# Patient Record
Sex: Male | Born: 1975 | Race: Black or African American | Hispanic: No | Marital: Married | Smoking: Former smoker
Health system: Southern US, Community
[De-identification: ages and names within clinical notes are randomized; demographics above are authoritative.]

## PROBLEM LIST (undated history)

## (undated) DIAGNOSIS — R569 Unspecified convulsions: Secondary | ICD-10-CM

## (undated) DIAGNOSIS — R002 Palpitations: Secondary | ICD-10-CM

## (undated) DIAGNOSIS — F419 Anxiety disorder, unspecified: Secondary | ICD-10-CM

## (undated) DIAGNOSIS — I10 Essential (primary) hypertension: Secondary | ICD-10-CM

## (undated) DIAGNOSIS — G40909 Epilepsy, unspecified, not intractable, without status epilepticus: Secondary | ICD-10-CM

## (undated) HISTORY — DX: Anxiety disorder, unspecified: F41.9

## (undated) HISTORY — DX: Palpitations: R00.2

---

## 1998-06-01 ENCOUNTER — Emergency Department (HOSPITAL_COMMUNITY): Admission: EM | Admit: 1998-06-01 | Discharge: 1998-06-01 | Payer: Self-pay

## 1998-06-09 ENCOUNTER — Emergency Department (HOSPITAL_COMMUNITY): Admission: EM | Admit: 1998-06-09 | Discharge: 1998-06-09 | Payer: Self-pay | Admitting: Internal Medicine

## 1998-07-18 ENCOUNTER — Ambulatory Visit (HOSPITAL_COMMUNITY): Admission: RE | Admit: 1998-07-18 | Discharge: 1998-07-18 | Payer: Self-pay | Admitting: Psychiatry

## 2000-03-08 ENCOUNTER — Encounter: Payer: Self-pay | Admitting: Emergency Medicine

## 2000-03-08 ENCOUNTER — Emergency Department (HOSPITAL_COMMUNITY): Admission: EM | Admit: 2000-03-08 | Discharge: 2000-03-08 | Payer: Self-pay | Admitting: *Deleted

## 2000-03-12 ENCOUNTER — Encounter: Payer: Self-pay | Admitting: Emergency Medicine

## 2000-03-12 ENCOUNTER — Emergency Department (HOSPITAL_COMMUNITY): Admission: EM | Admit: 2000-03-12 | Discharge: 2000-03-12 | Payer: Self-pay | Admitting: Emergency Medicine

## 2000-11-20 ENCOUNTER — Emergency Department (HOSPITAL_COMMUNITY): Admission: EM | Admit: 2000-11-20 | Discharge: 2000-11-20 | Payer: Self-pay | Admitting: Emergency Medicine

## 2001-01-27 ENCOUNTER — Emergency Department (HOSPITAL_COMMUNITY): Admission: EM | Admit: 2001-01-27 | Discharge: 2001-01-27 | Payer: Self-pay | Admitting: Emergency Medicine

## 2004-08-13 ENCOUNTER — Emergency Department (HOSPITAL_COMMUNITY): Admission: EM | Admit: 2004-08-13 | Discharge: 2004-08-13 | Payer: Self-pay | Admitting: Emergency Medicine

## 2005-11-27 ENCOUNTER — Encounter: Admission: RE | Admit: 2005-11-27 | Discharge: 2005-11-27 | Payer: Self-pay | Admitting: Orthopedic Surgery

## 2005-12-13 ENCOUNTER — Ambulatory Visit (HOSPITAL_BASED_OUTPATIENT_CLINIC_OR_DEPARTMENT_OTHER): Admission: RE | Admit: 2005-12-13 | Discharge: 2005-12-13 | Payer: Self-pay | Admitting: Orthopedic Surgery

## 2006-11-20 HISTORY — PX: SHOULDER ARTHROSCOPY W/ ACROMIAL REPAIR: SUR94

## 2008-06-06 ENCOUNTER — Encounter: Admission: RE | Admit: 2008-06-06 | Discharge: 2008-06-06 | Payer: Self-pay | Admitting: Family Medicine

## 2009-01-14 ENCOUNTER — Emergency Department (HOSPITAL_COMMUNITY): Admission: EM | Admit: 2009-01-14 | Discharge: 2009-01-14 | Payer: Self-pay | Admitting: Emergency Medicine

## 2010-02-02 ENCOUNTER — Emergency Department (HOSPITAL_COMMUNITY): Admission: EM | Admit: 2010-02-02 | Discharge: 2010-02-02 | Payer: Self-pay | Admitting: Emergency Medicine

## 2010-11-02 ENCOUNTER — Encounter
Admission: RE | Admit: 2010-11-02 | Discharge: 2010-11-02 | Payer: Self-pay | Source: Home / Self Care | Attending: Occupational Medicine | Admitting: Occupational Medicine

## 2011-04-07 NOTE — Op Note (Signed)
NAMEMAANAV, KASSABIAN               ACCOUNT NO.:  [id number]   MEDICAL RECORD NO.:  [id number]          PATIENT TYPE:  AMB   LOCATION:  DSC                          FACILITY:  [facility]   PHYSICIAN:  [name]. Sherlean Foot, M.D. DATE OF BIRTH:  1976-01-01   DATE OF PROCEDURE:  01/01/2006  DATE OF DISCHARGE:  12/13/2005                                 OPERATIVE REPORT   PREOPERATIVE DIAGNOSIS:  Left shoulder loose bodies and subacromial  impingement syndrome.   POSTOPERATIVE DIAGNOSIS:  Left shoulder loose bodies and subacromial  impingement syndrome.   OPERATION PERFORMED:  Left shoulder arthroscopy, subacromial decompression,  removal of loose bodies.   SURGEON:  [name]. Sherlean Foot, M.D.   ASSISTANT:  None.   ANESTHESIA:  General.   DESCRIPTION OF PROCEDURE:  The patient was laid supine and administered  general anesthesia.  Then placed in beach chair position.  Shoulder was  prepped and draped in the usual sterile fashion.  Anterior, posterior and  direct lateral portals were created with a #11 blade, blunt trocar and  cannula.  Diagnostic arthroscopy revealed several large loose bodies  approximately 10 to 12 and each being approximately 0.5 x 1 cm.  Almost  looked like synovial osteochondromatosis.  He had a history of trauma, so  these could have been traumatic loose bodies that had been growing through  the years.  After all of these were fished out, I then went into the  subacromial space and performed a subacromial decompression with a 4.0 mm  cylindrical bur from the direct lateral portal.  I then  __________ CA  ligament release with the arthroscope debridement wand.  I then closed with  4-0 nylon sutures, addressed with Adaptic, 4 x 4s, sterile Webril and Ace  wrap.           ______________________________  [name]. Sherlean Foot, M.D.     SDL/MEDQ  D:  01/01/2006  T:  01/01/2006  Job:  [id number]

## 2011-04-07 NOTE — Op Note (Signed)
NAMEGARI, HARTSELL               ACCOUNT NO.:  [id number]   MEDICAL RECORD NO.:  [id number]          PATIENT TYPE:  AMB   LOCATION:  DSC                          FACILITY:  [facility]   PHYSICIAN:  [name]. Sherlean Foot, M.D. DATE OF BIRTH:  1976/01/25   DATE OF PROCEDURE:  12/13/2005  DATE OF DISCHARGE:                                 OPERATIVE REPORT   SURGEON:  [name]. Sherlean Foot, M.D.   ASSISTANT:  None.   ANESTHESIA:  General.   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   PROCEDURE:  Left shoulder arthroscopy with multiple loose body removal,  glenohumeral debridement, and subacromial decompression.   INDICATIONS FOR PROCEDURE:  The patient is a 35 year old black male with  mechanical symptoms and MR arthrogram evidence of multiple loose bodies in  the joint.  He also had impingement syndrome with some changes in the  rotator cuff.  Informed consent was obtained.   DESCRIPTION OF PROCEDURE:  The patient was laid supine and administered  general anesthesia on the operating table.  He was placed in the beach chair  position and the left shoulder was prepped and draped in the usual sterile  fashion.  Inferolateral and inferomedial portals were created with a number  11 blade, blunt trocar, and cannula.  Diagnostic arthroscopy of the  glenohumeral joint revealed lots and lots of synovitis and pretty  significant arthritic changes in the glenoid anteriorly and on the humeral  head anteriorly.  There was approximately 14 loose bodies measuring anywhere  from 3 by 3 mm up to 1.5 by 1 cm.  These were removed sequentially from the  joint.  Once I had gotten those out, I debrided the synovitis with the  smaller Gator shaver.  I then went to the subacromial space and from the  direct lateral portal performed a bursectomy.  I then used the ArthroCare  debridement wand to perform a CA ligament release and further debrided off  the acromion.  I then used the cylindrical 4 mm bur to perform an  anterolateral acromioplasty.  I then closed with 4-0 nylon sutures, dressed  with Xeroform, dressing sponges, sterile ABDs, and 2 inch silk tape, and a  simple sling.  Complications none.  Drains none.  Estimated blood loss  minimal.           ______________________________  [name]. Sherlean Foot, M.D.     SDL/MEDQ  D:  12/13/2005  T:  12/13/2005  Job:  [id number]

## 2011-09-16 ENCOUNTER — Emergency Department (HOSPITAL_COMMUNITY)
Admission: EM | Admit: 2011-09-16 | Discharge: 2011-09-16 | Discharge status: Against Medical Advice | Payer: Self-pay | Attending: Emergency Medicine | Admitting: Emergency Medicine

## 2012-01-02 ENCOUNTER — Emergency Department (HOSPITAL_BASED_OUTPATIENT_CLINIC_OR_DEPARTMENT_OTHER)
Admission: EM | Admit: 2012-01-02 | Discharge: 2012-01-02 | Disposition: A | Discharge status: Home / Self Care | Payer: Managed Care, Other (non HMO) | Attending: Emergency Medicine | Admitting: Emergency Medicine

## 2012-01-02 ENCOUNTER — Encounter (HOSPITAL_BASED_OUTPATIENT_CLINIC_OR_DEPARTMENT_OTHER): Payer: Self-pay | Admitting: *Deleted

## 2012-01-02 DIAGNOSIS — M542 Cervicalgia: Secondary | ICD-10-CM | POA: Insufficient documentation

## 2012-01-02 DIAGNOSIS — R6884 Jaw pain: Secondary | ICD-10-CM | POA: Insufficient documentation

## 2012-01-02 DIAGNOSIS — R569 Unspecified convulsions: Secondary | ICD-10-CM | POA: Insufficient documentation

## 2012-01-02 DIAGNOSIS — H9209 Otalgia, unspecified ear: Secondary | ICD-10-CM | POA: Insufficient documentation

## 2012-01-02 DIAGNOSIS — I10 Essential (primary) hypertension: Secondary | ICD-10-CM | POA: Insufficient documentation

## 2012-01-02 HISTORY — DX: Essential (primary) hypertension: I10

## 2012-01-02 HISTORY — DX: Epilepsy, unspecified, not intractable, without status epilepticus: G40.909

## 2012-01-02 MED ORDER — IBUPROFEN 800 MG PO TABS: 800.0000 mg | ORAL_TABLET | Freq: Three times a day (TID) | ORAL | Status: AC

## 2012-01-02 MED ORDER — IBUPROFEN 800 MG PO TABS
800.0000 mg | ORAL_TABLET | Freq: Once | ORAL | Status: AC
  Administered 2012-01-02: 800 mg via ORAL
  Filled 2012-01-02: qty 1

## 2012-01-02 NOTE — ED Notes (Signed)
Pt c/o right jaw/throat /ear pain x 2 days

## 2012-01-02 NOTE — Discharge Instructions (Signed)

## 2012-01-02 NOTE — ED Provider Notes (Signed)
History     CSN: [id number]  Arrival date & time 01/02/12  1846   First MD Initiated Contact with Patient 01/02/12 1935      Chief Complaint  Patient presents with  . Jaw Pain  . Otalgia    (Consider location/radiation/quality/duration/timing/severity/associated sxs/prior treatment) Patient is a 36 y.o. male presenting with ear pain. The history is provided by the patient. No language interpreter was used.  Otalgia This is a new problem. The current episode started 2 days ago. There is pain in the right ear. The problem occurs constantly. There has been no fever. Associated symptoms include rhinorrhea. Pertinent negatives include no sore throat. His past medical history does not include chronic ear infection.  Pt reports he went to dentist today and had teeth cleaned.  Pt reports he had some sorness in right jaw.Pt reports dentist did not see any problems.  Pt complains of pain in right ear and in right side of neck  Past Medical History  Diagnosis Date  . Epilepsy   . Hypertension     Past Surgical History  Procedure Date  . Joint replacement     History reviewed. No pertinent family history.  History  Substance Use Topics  . Smoking status: Never Smoker   . Smokeless tobacco: Not on file  . Alcohol Use: No      Review of Systems  HENT: Positive for ear pain and rhinorrhea. Negative for sore throat.   All other systems reviewed and are negative.    Allergies  Review of patient's allergies indicates no known allergies.  Home Medications   Current Outpatient Rx  Name Route Sig Dispense Refill  . LEVETIRACETAM 500 MG PO TABS Oral Take 500 mg by mouth every 12 (twelve) hours.    Marland Kitchen LISINOPRIL 5 MG PO TABS Oral Take 5 mg by mouth daily.    . ADULT MULTIVITAMIN W/MINERALS CH Oral Take 1 tablet by mouth daily.      BP 151/66  Pulse 69  Temp(Src) 98.4 F (36.9 C) (Oral)  Resp 16  Ht 6' (1.829 m)  Wt 220 lb (99.791 kg)  BMI 29.84 kg/m2  SpO2 99%  Physical  Exam  Nursing note and vitals reviewed. Constitutional: He appears well-developed and well-nourished.  HENT:  Head: Normocephalic and atraumatic.  Left Ear: External ear normal.  Nose: Nose normal.  Mouth/Throat: Oropharynx is clear and moist.       Left tm clear. Right tm,  Question fluid  Eyes: Conjunctivae and EOM are normal. Pupils are equal, round, and reactive to light.  Neck: Normal range of motion. Neck supple.  Cardiovascular: Normal rate and normal heart sounds.   Pulmonary/Chest: Effort normal.  Abdominal: Soft.  Neurological: He is alert.  Skin: Skin is warm and dry.  Psychiatric: He has a normal mood and affect.    ED Course  Procedures (including critical care time)  Labs Reviewed - No data to display No results found.   No diagnosis found.    MDM  I advised pt ibuprofen,  Decongestants.  Return if any problems.        Langston Masker, [place] 01/02/12 2009

## 2012-01-04 NOTE — ED Provider Notes (Signed)
Medical screening examination/treatment/procedure(s) were performed by non-physician practitioner and as supervising physician I was immediately available for consultation/collaboration.  Ethelda Chick, MD 01/04/12 [phone]

## 2012-02-02 ENCOUNTER — Other Ambulatory Visit: Payer: Self-pay | Admitting: Physician Assistant

## 2012-03-06 ENCOUNTER — Ambulatory Visit (INDEPENDENT_AMBULATORY_CARE_PROVIDER_SITE_OTHER): Payer: Managed Care, Other (non HMO) | Admitting: Family Medicine

## 2012-03-06 VITALS — BP 143/79 | HR 83 | Temp 97.9°F | Resp 16 | Ht 71.0 in | Wt 220.0 lb

## 2012-03-06 DIAGNOSIS — I10 Essential (primary) hypertension: Secondary | ICD-10-CM

## 2012-03-06 LAB — COMPREHENSIVE METABOLIC PANEL
ALT: 85 U/L — ABNORMAL HIGH (ref 0–53)
AST: 58 U/L — ABNORMAL HIGH (ref 0–37)
BUN: 16 mg/dL (ref 6–23)
CO2: 26 mEq/L (ref 19–32)
Calcium: 9.2 mg/dL (ref 8.4–10.5)
Chloride: 106 mEq/L (ref 96–112)
Potassium: 4.1 mEq/L (ref 3.5–5.3)
Total Protein: 6.6 g/dL (ref 6.0–8.3)

## 2012-03-06 MED ORDER — LISINOPRIL 5 MG PO TABS: 5.0000 mg | ORAL_TABLET | Freq: Every day | ORAL | Status: DC

## 2012-03-06 NOTE — Progress Notes (Signed)
  Patient Name: Jim Fisher Date of Birth: 1976-08-15 Medical Record Number: [id number] Gender: male Date of Encounter: 03/06/2012  History of Present Illness:  Jim Fisher is a 36 y.o. very pleasant male patient who presents with the following:  Here today to have his BP medication refilled.  He notes no problems such as CP or SOB.  Exercises regularly.  He checks his BP at the pharmacy at times and generally gets about 120/ 80 There is no problem list on file for this patient.  Past Medical History  Diagnosis Date  . Epilepsy   . Hypertension    Past Surgical History  Procedure Date  . Joint replacement    History  Substance Use Topics  . Smoking status: Never Smoker   . Smokeless tobacco: Not on file  . Alcohol Use: No   No family history on file. No Known Allergies  Medication list has been reviewed and updated.  Review of Systems: As per HPI- otherwise negative.   Physical Examination: Filed Vitals:   03/06/12 1345  BP: 143/79  Pulse: 83  Temp: 97.9 F (36.6 C)  TempSrc: Oral  Resp: 16  Height: 5\' 11"  (1.803 m)  Weight: 220 lb (99.791 kg)    Body mass index is 30.68 kg/(m^2).  GEN: WDWN, NAD, Non-toxic, A & O x 3 HEENT: Atraumatic, Normocephalic. Neck supple. No masses, No LAD. Ears and Nose: No external deformity. CV: RRR, No M/G/R. No JVD. No thrill. No extra heart sounds. PULM: CTA B, no wheezes, crackles, rhonchi. No retractions. No resp. distress. No accessory muscle use. ABD: S, NT, ND, +BS. No rebound. No HSM. EXTR: No c/c/e NEURO Normal gait.  PSYCH: Normally interactive. Conversant. Not depressed or anxious appearing.  Calm demeanor.    Assessment and Plan: 1. Hypertension  lisinopril (PRINIVIL,ZESTRIL) 5 MG tablet, Comprehensive metabolic panel   Refilled medication, doing well.  Await labs and will contact with results

## 2012-03-07 ENCOUNTER — Encounter: Payer: Self-pay | Admitting: Family Medicine

## 2012-10-15 ENCOUNTER — Other Ambulatory Visit: Payer: Self-pay | Admitting: Physician Assistant

## 2012-10-15 ENCOUNTER — Telehealth: Payer: Self-pay

## 2012-10-15 MED ORDER — CLONAZEPAM 0.5 MG PO TABS: 0.5000 mg | ORAL_TABLET | Freq: Two times a day (BID) | ORAL | Status: DC | PRN

## 2012-10-15 NOTE — Telephone Encounter (Signed)
Pull paper chart 

## 2012-10-15 NOTE — Telephone Encounter (Signed)
Chart pulled it is in your box. Patient was previously given Klonopin 0.5mg  for flight last year at this time. Amy Pended Rx if you approve I can call in for him, just let me know.

## 2012-10-15 NOTE — Telephone Encounter (Signed)
Needs office visit.

## 2012-10-15 NOTE — Telephone Encounter (Signed)
Chart pulled to PA pool at nurses station [phone]

## 2012-10-15 NOTE — Telephone Encounter (Signed)
Looks like Lanora Manis has done this already, but it was not sent yet. I called it in. Laurence Folz

## 2012-10-15 NOTE — Telephone Encounter (Signed)
That is fine, he can have #10 to use PRN flight, max of 2 in a 24 hour period.

## 2012-10-15 NOTE — Telephone Encounter (Signed)
PT STATES HE IS GOING ON A FLIGHT AND NEEDED HIS ANXIETY MEDICINE CALLED IN. HAVE BEEN OVER A YEAR AGO SO HE ISN'T SURE THE NAME OF IT PLEASE CALL [phone]     [facility] [address]

## 2012-10-15 NOTE — Telephone Encounter (Signed)
Prescription for #10 printed at desk, needs OV.

## 2012-10-15 NOTE — Telephone Encounter (Signed)
That is fine, he can have #10, sig take 1/2 or 1 po prn flight, max 2 in 24 hours

## 2012-11-25 ENCOUNTER — Encounter: Payer: Self-pay | Admitting: Family Medicine

## 2012-11-25 ENCOUNTER — Ambulatory Visit (INDEPENDENT_AMBULATORY_CARE_PROVIDER_SITE_OTHER): Payer: Managed Care, Other (non HMO) | Admitting: Family Medicine

## 2012-11-25 VITALS — BP 126/90 | HR 70 | Temp 97.8°F | Resp 16 | Ht 71.0 in | Wt 225.0 lb

## 2012-11-25 DIAGNOSIS — Z0000 Encounter for general adult medical examination without abnormal findings: Secondary | ICD-10-CM

## 2012-11-25 DIAGNOSIS — Z23 Encounter for immunization: Secondary | ICD-10-CM

## 2012-11-25 DIAGNOSIS — I10 Essential (primary) hypertension: Secondary | ICD-10-CM

## 2012-11-25 LAB — COMPREHENSIVE METABOLIC PANEL
Glucose, Bld: 79 mg/dL (ref 70–99)
Sodium: 136 mEq/L (ref 135–145)
Total Bilirubin: 0.5 mg/dL (ref 0.3–1.2)
Total Protein: 7.4 g/dL (ref 6.0–8.3)

## 2012-11-25 LAB — LIPID PANEL
Cholesterol: 221 mg/dL — ABNORMAL HIGH (ref 0–200)
LDL Cholesterol: 156 mg/dL — ABNORMAL HIGH (ref 0–99)
Triglycerides: 70 mg/dL (ref <150)

## 2012-11-25 MED ORDER — LISINOPRIL 5 MG PO TABS: 5.0000 mg | ORAL_TABLET | Freq: Every day | ORAL | Status: DC

## 2012-11-25 NOTE — Progress Notes (Signed)
Subjective:    Patient ID: Jim Fisher, male    DOB: [birth year], 37 y.o.   MRN: [id number]  HPI Jim Fisher is a 37 y.o. male  Here for CPE. Doing well.  Fasting for 8 hours now.   Last Ov April 2013 for HTN. Controlled then. No recent outside Bp's.  Results for orders placed in visit on 03/06/12  COMPREHENSIVE METABOLIC PANEL      Component Value Range   Sodium 142  135 - 145 mEq/L   Potassium 4.1  3.5 - 5.3 mEq/L   Chloride 106  96 - 112 mEq/L   CO2 26  19 - 32 mEq/L   Glucose, Bld 87  70 - 99 mg/dL   BUN 16  6 - 23 mg/dL   Creat 7.25  3.66 - 4.40 mg/dL   Total Bilirubin 0.5  0.3 - 1.2 mg/dL   Alkaline Phosphatase 32 (*) 39 - 117 U/L   AST 58 (*) 0 - 37 U/L   ALT 85 (*) 0 - 53 U/L   Total Protein 6.6  6.0 - 8.3 g/dL   Albumin 4.2  3.5 - 5.2 g/dL   Calcium 9.2  8.4 - 34.7 mg/dL   Hx of seizure d/o - takes Keppra.  Neuro: [facility]. No recent seizure - last experienced 4 years ago.  No new side effects.   Ringing in ear only today.  No difficulty hearing.  No pain.   Still working at [employer].  Plans on trying out for [employer] [facility] [employer] Department next year (has to be seizure free for 5 years).   Married in '08 - 2 yo dtr.  Doing well.   Review of Systems  All other systems reviewed and are negative.  As on PHS,and reviewed CMA notes.     Objective:   Physical Exam  Vitals reviewed. Constitutional: He is oriented to person, place, and time. He appears well-developed and well-nourished.  HENT:  Head: Normocephalic and atraumatic.  Right Ear: External ear normal.  Left Ear: External ear normal.  Mouth/Throat: Oropharynx is clear and moist.  Eyes: Conjunctivae normal and EOM are normal. Pupils are equal, round, and reactive to light.  Neck: Normal range of motion. Neck supple. No thyromegaly present.  Cardiovascular: Normal rate, regular rhythm, normal heart sounds and intact distal pulses.   Pulmonary/Chest: Effort normal and breath sounds normal. No  respiratory distress. He has no wheezes.  Abdominal: Soft. He exhibits no distension. There is no tenderness. Hernia confirmed negative in the right inguinal area and confirmed negative in the left inguinal area.  Musculoskeletal: Normal range of motion. He exhibits no edema and no tenderness.  Lymphadenopathy:    He has no cervical adenopathy.  Neurological: He is alert and oriented to person, place, and time. He has normal reflexes.  Skin: Skin is warm and dry.  Psychiatric: He has a normal mood and affect. His behavior is normal.   Filed Vitals:   11/25/12 1534  BP: 126/90  Pulse: 70  Temp: 97.8 F (36.6 C)  Resp: 16       Assessment & Plan:  Jim Fisher is a 37 y.o. male 1. Routine general medical examination at a health care facility  Comprehensive metabolic panel, Lipid panel, CBC with Differential.  Anticipatory guidance given.   2. Essential hypertension, benign  Comprehensive metabolic panel.  Controlled.  meds refilled x 1 year.   3. Hypertension  lisinopril (PRINIVIL,ZESTRIL) 5 MG tablet  4. Need for prophylactic vaccination and  inoculation against influenza  Flu vaccine greater than or equal to 3yo with preservative IM given.Marland Kitchen  Hx of seizure d/o - seizure free.  Patient Instructions  Your should receive a call or letter about your lab results within the next week to 10 days.  Keep a record of your blood pressures outside of the office and bring them to the next office visit. Recheck in 6 months  - sooner if worsening of symptoms.   Keeping you healthy  Get these tests  Blood pressure- Have your blood pressure checked once a year by your healthcare provider.  Normal blood pressure is 120/80.  Weight- Have your body mass index (BMI) calculated to screen for obesity.  BMI is a measure of body fat based on height and weight. You can also calculate your own BMI at https://www.west-esparza.com/.  Cholesterol- Have your cholesterol checked regularly starting at age 35,  sooner may be necessary if you have diabetes, high blood pressure, if a family member developed heart diseases at an early age or if you smoke.   Chlamydia, HIV, and other sexual transmitted disease- Get screened each year until the age of 39 then within three months of each new sexual partner.  Diabetes- Have your blood sugar checked regularly if you have high blood pressure, high cholesterol, a family history of diabetes or if you are overweight.  Get these vaccines  Flu shot- Every fall.  Tetanus shot- Every 10 years.  Menactra- Single dose; prevents meningitis.  Take these steps  Don't smoke- If you do smoke, ask your healthcare provider about quitting. For tips on how to quit, go to www.smokefree.gov or call 1-800-QUIT-NOW.  Be physically active- Exercise 5 days a week for at least 30 minutes.  If you are not already physically active start slow and gradually work up to 30 minutes of moderate physical activity.  Examples of moderate activity include walking briskly, mowing the yard, dancing, swimming bicycling, etc.  Eat a healthy diet- Eat a variety of healthy foods such as fruits, vegetables, low fat milk, low fat cheese, yogurt, lean meats, poultry, fish, beans, tofu, etc.  For more information on healthy eating, go to www.thenutritionsource.org  Drink alcohol in moderation- Limit alcohol intake two drinks or less a day.  Never drink and drive.  Dentist- Brush and floss teeth twice daily; visit your dentis twice a year.  Depression-Your emotional health is as important as your physical health.  If you're feeling down, losing interest in things you normally enjoy please talk with your healthcare provider.  Gun Safety- If you keep a gun in your home, keep it unloaded and with the safety lock on.  Bullets should be stored separately.  Helmet use- Always wear a helmet when riding a motorcycle, bicycle, rollerblading or skateboarding.  Safe sex- If you may be exposed to a sexually  transmitted infection, use a condom  Seat belts- Seat bels can save your life; always wear one.  Smoke/Carbon Monoxide detectors- These detectors need to be installed on the appropriate level of your home.  Replace batteries at least once a year.  Skin Cancer- When out in the sun, cover up and use sunscreen SPF 15 or higher.  Violence- If anyone is threatening or hurting you, please tell your healthcare provider.

## 2012-11-25 NOTE — Patient Instructions (Addendum)
Your should receive a call or letter about your lab results within the next week to 10 days.  Keep a record of your blood pressures outside of the office and bring them to the next office visit. Recheck in 6 months  - sooner if worsening of symptoms.   Keeping you healthy  Get these tests  Blood pressure- Have your blood pressure checked once a year by your healthcare provider.  Normal blood pressure is 120/80.  Weight- Have your body mass index (BMI) calculated to screen for obesity.  BMI is a measure of body fat based on height and weight. You can also calculate your own BMI at https://www.west-esparza.com/.  Cholesterol- Have your cholesterol checked regularly starting at age 67, sooner may be necessary if you have diabetes, high blood pressure, if a family member developed heart diseases at an early age or if you smoke.   Chlamydia, HIV, and other sexual transmitted disease- Get screened each year until the age of 27 then within three months of each new sexual partner.  Diabetes- Have your blood sugar checked regularly if you have high blood pressure, high cholesterol, a family history of diabetes or if you are overweight.  Get these vaccines  Flu shot- Every fall.  Tetanus shot- Every 10 years.  Menactra- Single dose; prevents meningitis.  Take these steps  Don't smoke- If you do smoke, ask your healthcare provider about quitting. For tips on how to quit, go to www.smokefree.gov or call 1-800-QUIT-NOW.  Be physically active- Exercise 5 days a week for at least 30 minutes.  If you are not already physically active start slow and gradually work up to 30 minutes of moderate physical activity.  Examples of moderate activity include walking briskly, mowing the yard, dancing, swimming bicycling, etc.  Eat a healthy diet- Eat a variety of healthy foods such as fruits, vegetables, low fat milk, low fat cheese, yogurt, lean meats, poultry, fish, beans, tofu, etc.  For more information on  healthy eating, go to www.thenutritionsource.org  Drink alcohol in moderation- Limit alcohol intake two drinks or less a day.  Never drink and drive.  Dentist- Brush and floss teeth twice daily; visit your dentis twice a year.  Depression-Your emotional health is as important as your physical health.  If you're feeling down, losing interest in things you normally enjoy please talk with your healthcare provider.  Gun Safety- If you keep a gun in your home, keep it unloaded and with the safety lock on.  Bullets should be stored separately.  Helmet use- Always wear a helmet when riding a motorcycle, bicycle, rollerblading or skateboarding.  Safe sex- If you may be exposed to a sexually transmitted infection, use a condom  Seat belts- Seat bels can save your life; always wear one.  Smoke/Carbon Monoxide detectors- These detectors need to be installed on the appropriate level of your home.  Replace batteries at least once a year.  Skin Cancer- When out in the sun, cover up and use sunscreen SPF 15 or higher.  Violence- If anyone is threatening or hurting you, please tell your healthcare provider.

## 2012-11-25 NOTE — Progress Notes (Signed)
  Subjective:    Patient ID: Jim Fisher, male    DOB: [birth year], 37 y.o.   MRN: [id number]  HPI    Review of Systems  Constitutional: Negative.   HENT: Positive for tinnitus.   Eyes: Negative.   Respiratory: Negative.   Cardiovascular: Negative.   Gastrointestinal: Negative.   Genitourinary: Negative.   Musculoskeletal: Negative.   Skin: Negative.   Neurological: Negative.   Hematological: Negative.   Psychiatric/Behavioral: Negative.        Objective:   Physical Exam        Assessment & Plan:

## 2012-11-26 LAB — CBC WITH DIFFERENTIAL/PLATELET
Basophils Relative: 0 % (ref 0–1)
Eosinophils Absolute: 0.1 10*3/uL (ref 0.0–0.7)
Eosinophils Relative: 1 % (ref 0–5)
Hemoglobin: 14.4 g/dL (ref 13.0–17.0)
Lymphocytes Relative: 37 % (ref 12–46)
MCV: 79.6 fL (ref 78.0–100.0)
Neutrophils Relative %: 56 % (ref 43–77)
Platelets: 308 10*3/uL (ref 150–400)
RDW: 13.7 % (ref 11.5–15.5)

## 2012-12-24 ENCOUNTER — Ambulatory Visit (INDEPENDENT_AMBULATORY_CARE_PROVIDER_SITE_OTHER): Payer: Managed Care, Other (non HMO) | Admitting: Family Medicine

## 2012-12-24 VITALS — BP 119/76 | HR 68 | Temp 98.1°F | Resp 16 | Ht 71.25 in | Wt 224.2 lb

## 2012-12-24 DIAGNOSIS — G40309 Generalized idiopathic epilepsy and epileptic syndromes, not intractable, without status epilepticus: Secondary | ICD-10-CM

## 2012-12-24 DIAGNOSIS — R319 Hematuria, unspecified: Secondary | ICD-10-CM

## 2012-12-24 LAB — POCT URINALYSIS DIPSTICK
Bilirubin, UA: NEGATIVE
Blood, UA: NEGATIVE
Glucose, UA: NEGATIVE
Ketones, UA: NEGATIVE
Leukocytes, UA: NEGATIVE
Nitrite, UA: NEGATIVE
Protein, UA: NEGATIVE
Spec Grav, UA: 1.01
Urobilinogen, UA: 0.2
pH, UA: 6.5

## 2012-12-24 LAB — POCT UA - MICROSCOPIC ONLY
Bacteria, U Microscopic: NEGATIVE
Casts, Ur, LPF, POC: NEGATIVE
Crystals, Ur, HPF, POC: NEGATIVE
Mucus, UA: NEGATIVE
RBC, urine, microscopic: NEGATIVE
WBC, Ur, HPF, POC: NEGATIVE
Yeast, UA: NEGATIVE

## 2012-12-24 NOTE — Progress Notes (Signed)
37 yo with discomfort while voiding beginning Thursday, then again last night and this am with small amount of clot.  Associated with mild flank pain on left No fever or vomiting Happily married, monogamous Works at [employer]  Objective:  NAD Results for orders placed in visit on 11/25/12  COMPREHENSIVE METABOLIC PANEL      Component Value Range   Sodium 136  135 - 145 mEq/L   Potassium 4.1  3.5 - 5.3 mEq/L   Chloride 103  96 - 112 mEq/L   CO2 26  19 - 32 mEq/L   Glucose, Bld 79  70 - 99 mg/dL   BUN 14  6 - 23 mg/dL   Creat 1.61  0.96 - 0.45 mg/dL   Total Bilirubin 0.5  0.3 - 1.2 mg/dL   Alkaline Phosphatase 31 (*) 39 - 117 U/L   AST 21  0 - 37 U/L   ALT 18  0 - 53 U/L   Total Protein 7.4  6.0 - 8.3 g/dL   Albumin 4.6  3.5 - 5.2 g/dL   Calcium 9.8  8.4 - 40.9 mg/dL  LIPID PANEL      Component Value Range   Cholesterol 221 (*) 0 - 200 mg/dL   Triglycerides 70  <811 mg/dL   HDL 51  >91 mg/dL   Total CHOL/HDL Ratio 4.3     VLDL 14  0 - 40 mg/dL   LDL Cholesterol 478 (*) 0 - 99 mg/dL  CBC WITH DIFFERENTIAL      Component Value Range   WBC 9.6  4.0 - 10.5 K/uL   RBC 5.38  4.22 - 5.81 MIL/uL   Hemoglobin 14.4  13.0 - 17.0 g/dL   HCT 29.5  62.1 - 30.8 %   MCV 79.6  78.0 - 100.0 fL   MCH 26.8  26.0 - 34.0 pg   MCHC 33.6  30.0 - 36.0 g/dL   RDW 65.7  84.6 - 96.2 %   Platelets 308  150 - 400 K/uL   Neutrophils Relative 56  43 - 77 %   Neutro Abs 5.3  1.7 - 7.7 K/uL   Lymphocytes Relative 37  12 - 46 %   Lymphs Abs 3.6  0.7 - 4.0 K/uL   Monocytes Relative 6  3 - 12 %   Monocytes Absolute 0.5  0.1 - 1.0 K/uL   Eosinophils Relative 1  0 - 5 %   Eosinophils Absolute 0.1  0.0 - 0.7 K/uL   Basophils Relative 0  0 - 1 %   Basophils Absolute 0.0  0.0 - 0.1 K/uL   Smear Review Criteria for review not met      Results for orders placed in visit on 12/24/12  POCT URINALYSIS DIPSTICK      Component Value Range   Color, UA yellow     Clarity, UA clear     Glucose, UA neg     Bilirubin, UA neg     Ketones, UA neg     Spec Grav, UA 1.010     Blood, UA neg     pH, UA 6.5     Protein, UA neg     Urobilinogen, UA 0.2     Nitrite, UA neg     Leukocytes, UA Negative    POCT UA - MICROSCOPIC ONLY      Component Value Range   WBC, Ur, HPF, POC neg     RBC, urine, microscopic neg     Bacteria, U  Microscopic neg     Mucus, UA neg     Epithelial cells, urine per micros rare     Crystals, Ur, HPF, POC neg     Casts, Ur, LPF, POC neg     Yeast, UA neg     Assessment:  No evidence of a problem on U/A.   Plan:  Urine cx uriprobe  If symptoms persist or worsen, and labs are unrevealing, will proceed with CAT scan of kidneys.

## 2012-12-25 LAB — GC/CHLAMYDIA PROBE AMP, URINE
Chlamydia, Swab/Urine, PCR: NEGATIVE
GC Probe Amp, Urine: NEGATIVE

## 2012-12-26 LAB — URINE CULTURE: Colony Count: 3000

## 2012-12-30 ENCOUNTER — Telehealth: Payer: Self-pay

## 2012-12-30 DIAGNOSIS — R319 Hematuria, unspecified: Secondary | ICD-10-CM

## 2012-12-30 DIAGNOSIS — R109 Unspecified abdominal pain: Secondary | ICD-10-CM

## 2012-12-30 NOTE — Telephone Encounter (Signed)
Called patient, Dr Milus Glazier office visit states if not better will proceed with CT Scan, have spoken to patient he is having right flank pain, and has not improved since last office visit. Pended order for CT scan.

## 2012-12-30 NOTE — Telephone Encounter (Signed)
Signed order for CT

## 2012-12-30 NOTE — Telephone Encounter (Signed)
Pt is needing to talk with someone about last visit -he states he was supposed to have a scan and referral to a specialist  But referrals has nothing   Best number [phone]

## 2012-12-30 NOTE — Addendum Note (Signed)
Addended by: Godfrey Pick on: 12/30/2012 05:57 PM   Modules accepted: Orders

## 2012-12-30 NOTE — Telephone Encounter (Signed)
Number is [phone]

## 2012-12-30 NOTE — Telephone Encounter (Signed)
Pt is needing to talk with someone about his last visit -he states that he was under the impression that he was going to sent for a ct and also referred to specialist and nothing is in his chart or for referrals  Best number

## 2012-12-30 NOTE — Addendum Note (Signed)
Addended byCaffie Damme on: 12/30/2012 12:45 PM   Modules accepted: Orders

## 2012-12-31 ENCOUNTER — Ambulatory Visit
Admission: RE | Admit: 2012-12-31 | Discharge: 2012-12-31 | Disposition: A | Discharge status: Home / Self Care | Payer: Managed Care, Other (non HMO) | Source: Ambulatory Visit | Attending: Physician Assistant | Admitting: Physician Assistant

## 2012-12-31 DIAGNOSIS — R109 Unspecified abdominal pain: Secondary | ICD-10-CM

## 2012-12-31 DIAGNOSIS — R319 Hematuria, unspecified: Secondary | ICD-10-CM

## 2013-01-01 ENCOUNTER — Telehealth: Payer: Self-pay | Admitting: Physician Assistant

## 2013-01-01 DIAGNOSIS — R319 Hematuria, unspecified: Secondary | ICD-10-CM

## 2013-01-01 NOTE — Telephone Encounter (Signed)
Referral to urology placed

## 2013-01-03 NOTE — Telephone Encounter (Signed)
Pt has had CT, been notified of results and is waiting for urology appt.

## 2013-02-18 ENCOUNTER — Other Ambulatory Visit: Payer: Self-pay | Admitting: Family Medicine

## 2013-06-28 ENCOUNTER — Other Ambulatory Visit: Payer: Self-pay | Admitting: Neurology

## 2013-07-22 ENCOUNTER — Ambulatory Visit (INDEPENDENT_AMBULATORY_CARE_PROVIDER_SITE_OTHER): Payer: Managed Care, Other (non HMO) | Admitting: Nurse Practitioner

## 2013-07-22 ENCOUNTER — Encounter: Payer: Self-pay | Admitting: Nurse Practitioner

## 2013-07-22 VITALS — BP 128/72 | Ht 73.0 in | Wt 221.0 lb

## 2013-07-22 DIAGNOSIS — G40309 Generalized idiopathic epilepsy and epileptic syndromes, not intractable, without status epilepticus: Secondary | ICD-10-CM

## 2013-07-22 MED ORDER — LEVETIRACETAM 750 MG PO TABS: 750.0000 mg | ORAL_TABLET | Freq: Two times a day (BID) | ORAL | Status: DC

## 2013-07-22 NOTE — Progress Notes (Signed)
I have read the note, and I agree with the clinical assessment and plan.  WILLIS,CHARLES KEITH   

## 2013-07-22 NOTE — Patient Instructions (Addendum)
Continue Keppra at current dose will renew Call our office for any seizure activity Followup yearly and prn

## 2013-07-22 NOTE — Progress Notes (Signed)
  Reason for visit followup for seizure disorder HPI: Jim Fisher,  37 year old black male returns for followup. He has a history of seizures. This patient has done very well on Keppra, taking 750 mg twice daily. Patient is tolerating the medication very well. Patient has changed his diet, and this has improved his hypertension, he is currently on Lisinopril.  Patient is operating a motor vehicle. Patient works in Airline pilot at [employer]. Last seizure 4.5 yrs ago. Denies side effects to Keppra.   ROS:  14 system review of symptoms is negative   Medications Current Outpatient Prescriptions on File Prior to Visit  Medication Sig Dispense Refill  . levETIRAcetam (KEPPRA) 750 MG tablet TAKE 1 TABLET BY MOUTH TWICE A DAY  60 tablet  0  . lisinopril (PRINIVIL,ZESTRIL) 5 MG tablet Take 1 tablet (5 mg total) by mouth daily.  90 tablet  3  . Multiple Vitamin (MULITIVITAMIN WITH MINERALS) TABS Take 1 tablet by mouth daily.      . NON FORMULARY daily.       No current facility-administered medications on file prior to visit.    Allergies No Known Allergies  Physical Exam General: well developed, well nourished, seated, in no evident distress Head: head normocephalic and atraumatic. Oropharynx benign Neck: supple with no carotid  bruits Cardiovascular: regular rate and rhythm, no murmurs  Neurologic Exam Mental Status: Awake and fully alert. Oriented to place and time. Follows all commands. Speech and language normal.   Cranial Nerves: Pupils equal, briskly reactive to light. Extraocular movements full without nystagmus. Visual fields full to confrontation. Hearing intact and symmetric to finger snap. Face, tongue, palate move normally and symmetrically. Neck flexion and extension normal.  Motor: Normal bulk and tone. Normal strength in all tested extremity muscles.No focal weakness Coordination: Rapid alternating movements normal in all extremities. Finger-to-nose and heel-to-shin performed accurately  bilaterally. No dysmetria Gait and Station: Arises from chair without difficulty. Stance is normal. Gait demonstrates normal stride length and balance . Able to heel, toe and tandem walk without difficulty.  Reflexes: 2+ and symmetric. Toes downgoing.     ASSESSMENT: Seizure disorder with last seizure occurring 4-1/2 years ago     PLAN: Continue Keppra at current dose will renew Followup yearly and as necessary  Nilda Riggs, GNP-BC APRN

## 2013-08-14 ENCOUNTER — Other Ambulatory Visit: Payer: Self-pay

## 2013-08-14 MED ORDER — LEVETIRACETAM 750 MG PO TABS: 750.0000 mg | ORAL_TABLET | Freq: Two times a day (BID) | ORAL | Status: DC

## 2013-08-14 NOTE — Telephone Encounter (Signed)
Pharmacy requests 90 day Rx  

## 2013-10-30 ENCOUNTER — Ambulatory Visit (INDEPENDENT_AMBULATORY_CARE_PROVIDER_SITE_OTHER): Payer: Managed Care, Other (non HMO) | Admitting: Family Medicine

## 2013-10-30 ENCOUNTER — Telehealth: Payer: Self-pay

## 2013-10-30 VITALS — BP 132/70 | HR 91 | Temp 98.7°F | Resp 18 | Ht 71.5 in | Wt 215.0 lb

## 2013-10-30 DIAGNOSIS — R197 Diarrhea, unspecified: Secondary | ICD-10-CM

## 2013-10-30 DIAGNOSIS — B349 Viral infection, unspecified: Secondary | ICD-10-CM

## 2013-10-30 DIAGNOSIS — B9789 Other viral agents as the cause of diseases classified elsewhere: Secondary | ICD-10-CM

## 2013-10-30 DIAGNOSIS — R6889 Other general symptoms and signs: Secondary | ICD-10-CM

## 2013-10-30 LAB — POCT CBC
Granulocyte percent: 70.5 % (ref 37–80)
HCT, POC: 45.8 % (ref 43.5–53.7)
Hemoglobin: 14.3 g/dL (ref 14.1–18.1)
Lymph, poc: 1.6 (ref 0.6–3.4)
MCH, POC: 27 pg (ref 27–31.2)
MCHC: 31.2 g/dL — AB (ref 31.8–35.4)
MCV: 86.5 fL (ref 80–97)
MID (cbc): 0.8 (ref 0–0.9)
MPV: 8.1 fL (ref 0–99.8)
POC Granulocyte: 5.7 (ref 2–6.9)
POC LYMPH PERCENT: 20.2 %L (ref 10–50)
POC MID %: 9.3 %M (ref 0–12)
Platelet Count, POC: 306 10*3/uL (ref 142–424)
RBC: 5.3 M/uL (ref 4.69–6.13)
RDW, POC: 13.6 %
WBC: 8.1 10*3/uL (ref 4.6–10.2)

## 2013-10-30 LAB — POCT INFLUENZA A/B
Influenza A, POC: NEGATIVE
Influenza B, POC: NEGATIVE

## 2013-10-30 NOTE — Progress Notes (Signed)
Chief Complaint:  Chief Complaint  Patient presents with  . Abdominal Pain    co-worker has flu   . Back Pain  . Knee Pain  . Cough    HPI: Jim Fisher is a 37 y.o. male  With a h/o epilepsy on Keppra who is here for abdominal pain x one day. He woke up today with body aches and a headache. There has been positive flu cases at work. He works at [employer]. He has had diarrhea with black stool. Pepto Bismol helped him shortly yesterday but didn't help for long. He has not had the flu vaccine yet. No fever that he can tell, mild nasal congestion, cough, and denies any ear pain or sore throat. He ate [name] 2 days ago and his wife just texted him and state she has similar sxs. Nonbloody diarrhea, black stool but again he has been on peptobismol. Denies nausea or vomiting. Denies fevers. + Dry cough.   Last seizure was 5 years ago. Did not get flu vaccine this year.  He is able to eat and drink but has some loose stools after PO intake.  2 days ago one of the buyers had the flu.    Past Medical History  Diagnosis Date  . Epilepsy   . Hypertension   . Anxiety    Past Surgical History  Procedure Laterality Date  . Shoulder arthroscopy w/ acromial repair  2008   History   Social History  . Marital Status: Married    Spouse Name: Jim Fisher     Number of Children: 1  . Years of Education: N/A   Occupational History  . SALES    Social History Main Topics  . Smoking status: Former Smoker -- 2 years    Types: Cigarettes    Quit date: 11/20/1996  . Smokeless tobacco: Never Used     Comment: SMOKED IN HIGH SCHOOL AT LEAST 3 CIGARETTES PER DAY  . Alcohol Use: 0.6 oz/week    1 Cans of beer per week     Comment: every 2 weeks   . Drug Use: No  . Sexual Activity: Yes     Comment: number of sex partners in the last 12 months 1   Other Topics Concern  . None   Social History Narrative   Exercise doing weights and cardio 5 x/week for 1 hour.    Patient works at [employer].   Patient has some college.   Patient is married with one child.    Family History  Problem Relation Age of Onset  . Hypertension Maternal Grandmother   . Diabetes Maternal Grandmother    No Known Allergies Prior to Admission medications   Medication Sig Start Date End Date Taking? Authorizing Provider  levETIRAcetam (KEPPRA) 750 MG tablet Take 1 tablet (750 mg total) by mouth 2 (two) times daily. 08/14/13  Yes York Spaniel, MD  lisinopril (PRINIVIL,ZESTRIL) 5 MG tablet Take 1 tablet (5 mg total) by mouth daily. 11/25/12  Yes Shade Flood, MD  Multiple Vitamin (MULITIVITAMIN WITH MINERALS) TABS Take 1 tablet by mouth daily.   Yes Historical Provider, MD     ROS: The patient denies fevers, chills, night sweats, unintentional weight loss, chest pain, palpitations, wheezing, dyspnea on exertion, dysuria, hematuria, melena, numbness, weakness, or tingling.   All other systems have been reviewed and were otherwise negative with the exception of those mentioned in the HPI and as above.    PHYSICAL EXAM: Filed Vitals:  10/30/13 1556  BP: 132/70  Pulse: 91  Temp: 98.7 F (37.1 C)  Resp: 18   Filed Vitals:   10/30/13 1556  Height: 5' 11.5" (1.816 m)  Weight: 215 lb (97.523 kg)   Body mass index is 29.57 kg/(m^2).  General: Alert, no acute distress HEENT:  Normocephalic, atraumatic, oropharynx patent. EOMI, PERRLA, TM nl, fundoscopic exam nl, oral mucose nl, boggy nares Cardiovascular:  Regular rate and rhythm, no rubs murmurs or gallops.  No Carotid bruits, radial pulse intact. No pedal edema.  Respiratory: Clear to auscultation bilaterally.  No wheezes, rales, or rhonchi.  No cyanosis, no use of accessory musculature GI: No organomegaly, abdomen is soft and non-tender, positive bowel sounds.  No masses. Skin: No rashes. Neurologic: Facial musculature symmetric. Psychiatric: Patient is appropriate throughout our interaction. Lymphatic: No cervical  lymphadenopathy Musculoskeletal: Gait intact.   LABS: Results for orders placed in visit on 10/30/13  POCT CBC      Result Value Range   WBC 8.1  4.6 - 10.2 K/uL   Lymph, poc 1.6  0.6 - 3.4   POC LYMPH PERCENT 20.2  10 - 50 %L   MID (cbc) 0.8  0 - 0.9   POC MID % 9.3  0 - 12 %M   POC Granulocyte 5.7  2 - 6.9   Granulocyte percent 70.5  37 - 80 %G   RBC 5.30  4.69 - 6.13 M/uL   Hemoglobin 14.3  14.1 - 18.1 g/dL   HCT, POC 16.1  09.6 - 53.7 %   MCV 86.5  80 - 97 fL   MCH, POC 27.0  27 - 31.2 pg   MCHC 31.2 (*) 31.8 - 35.4 g/dL   RDW, POC 04.5     Platelet Count, POC 306  142 - 424 K/uL   MPV 8.1  0 - 99.8 fL  POCT INFLUENZA A/B      Result Value Range   Influenza A, POC Negative     Influenza B, POC Negative       EKG/XRAY:   Primary read interpreted by Dr. Conley Rolls at [facility].   ASSESSMENT/PLAN: Encounter Diagnoses  Name Primary?  . Diarrhea   . Flu-like symptoms   . Viral syndrome Yes   Etiology Viral syndrome vs food related GI sxs? Probably conincidental that he has viral sxs 2 days after he had [employer]. I am not sure if diarrhea is related to food intake from [facility]. More likely he has early signs of viral URI with some loose stools and diarrhea. He denies any nausea/vomiting.  Continue to monitor, needs hydration. No nausea meds given since denies nausea.  Push fluid, BRAT diet F/u prn  Gross sideeffects, risk and benefits, and alternatives of medications d/w patient. Patient is aware that all medications have potential sideeffects and we are unable to predict every sideeffect or drug-drug interaction that may occur.  Hamilton Capri PHUONG, DO 10/30/2013 5:11 PM

## 2013-10-30 NOTE — Patient Instructions (Signed)
Diet for Diarrhea, Adult  Frequent, runny stools (diarrhea) may be caused or worsened by food or drink. Diarrhea may be relieved by changing your diet. Since diarrhea can last up to 7 days, it is easy for you to lose too much fluid from the body and become dehydrated. Fluids that are lost need to be replaced. Along with a modified diet, make sure you drink enough fluids to keep your urine clear or pale yellow.  DIET INSTRUCTIONS  · Ensure adequate fluid intake (hydration): have 1 cup (8 oz) of fluid for each diarrhea episode. Avoid fluids that contain simple sugars or sports drinks, fruit juices, whole milk products, and sodas. Your urine should be clear or pale yellow if you are drinking enough fluids. Hydrate with an oral rehydration solution that you can purchase at pharmacies, retail stores, and online. You can prepare an oral rehydration solution at home by mixing the following ingredients together:  ·   tsp table salt.  · ¾ tsp baking soda.  ·  tsp salt substitute containing potassium chloride.  · 1  tablespoons sugar.  · 1 L (34 oz) of water.  · Certain foods and beverages may increase the speed at which food moves through the gastrointestinal (GI) tract. These foods and beverages should be avoided and include:  · Caffeinated and alcoholic beverages.  · High-fiber foods, such as raw fruits and vegetables, nuts, seeds, and whole grain breads and cereals.  · Foods and beverages sweetened with sugar alcohols, such as xylitol, sorbitol, and mannitol.  · Some foods may be well tolerated and may help thicken stool including:  · Starchy foods, such as rice, toast, pasta, low-sugar cereal, oatmeal, grits, baked potatoes, crackers, and bagels.    · Bananas.    · Applesauce.  · Add probiotic-rich foods to help increase healthy bacteria in the GI tract, such as yogurt and fermented milk products.  RECOMMENDED FOODS AND BEVERAGES  Starches  Choose foods with less than 2 g of fiber per serving.  · Recommended:  White,  French, and pita breads, plain rolls, buns, bagels. Plain muffins, matzo. Soda, saltine, or graham crackers. Pretzels, melba toast, zwieback. Cooked cereals made with water: cornmeal, farina, cream cereals. Dry cereals: refined corn, wheat, rice. Potatoes prepared any way without skins, refined macaroni, spaghetti, noodles, refined rice.  · Avoid:  Bread, rolls, or crackers made with whole wheat, multi-grains, rye, bran seeds, nuts, or coconut. Corn tortillas or taco shells. Cereals containing whole grains, multi-grains, bran, coconut, nuts, raisins. Cooked or dry oatmeal. Coarse wheat cereals, granola. Cereals advertised as "high-fiber." Potato skins. Whole grain pasta, wild or brown rice. Popcorn. Sweet potatoes, yams. Sweet rolls, doughnuts, waffles, pancakes, sweet breads.  Vegetables  · Recommended: Strained tomato and vegetable juices. Most well-cooked and canned vegetables without seeds. Fresh: Tender lettuce, cucumber without the skin, cabbage, spinach, bean sprouts.  · Avoid: Fresh, cooked, or canned: Artichokes, baked beans, beet greens, broccoli, Brussels sprouts, corn, kale, legumes, peas, sweet potatoes. Cooked: Green or red cabbage, spinach. Avoid large servings of any vegetables because vegetables shrink when cooked, and they contain more fiber per serving than fresh vegetables.  Fruit  · Recommended: Cooked or canned: Apricots, applesauce, cantaloupe, cherries, fruit cocktail, grapefruit, grapes, kiwi, mandarin oranges, peaches, pears, plums, watermelon. Fresh: Apples without skin, ripe banana, grapes, cantaloupe, cherries, grapefruit, peaches, oranges, plums. Keep servings limited to ½ cup or 1 piece.  · Avoid: Fresh: Apples with skin, apricots, mangoes, pears, raspberries, strawberries. Prune juice, stewed or dried prunes. Dried   fruits, raisins, dates. Large servings of all fresh fruits.  Protein  · Recommended: Ground or well-cooked tender beef, ham, veal, lamb, pork, or poultry. Eggs. Fish,  oysters, shrimp, lobster, other seafoods. Liver, organ meats.  · Avoid: Tough, fibrous meats with gristle. Peanut butter, smooth or chunky. Cheese, nuts, seeds, legumes, dried peas, beans, lentils.  Dairy  · Recommended: Yogurt, lactose-free milk, kefir, drinkable yogurt, buttermilk, soy milk, or plain hard cheese.  · Avoid: Milk, chocolate milk, beverages made with milk, such as milkshakes.  Soups  · Recommended: Bouillon, broth, or soups made from allowed foods. Any strained soup.  · Avoid: Soups made from vegetables that are not allowed, cream or milk-based soups.  Desserts and Sweets  · Recommended: Sugar-free gelatin, sugar-free frozen ice pops made without sugar alcohol.  · Avoid: Plain cakes and cookies, pie made with fruit, pudding, custard, cream pie. Gelatin, fruit, ice, sherbet, frozen ice pops. Ice cream, ice milk without nuts. Plain hard candy, honey, jelly, molasses, syrup, sugar, chocolate syrup, gumdrops, marshmallows.  Fats and Oils  · Recommended: Limit fats to less than 8 tsp per day.  · Avoid: Seeds, nuts, olives, avocados. Margarine, butter, cream, mayonnaise, salad oils, plain salad dressings. Plain gravy, crisp bacon without rind.  Beverages  · Recommended: Water, decaffeinated teas, oral rehydration solutions, sugar-free beverages not sweetened with sugar alcohols.  · Avoid: Fruit juices, caffeinated beverages (coffee, tea, soda), alcohol, sports drinks, or lemon-lime soda.  Condiments  · Recommended: Ketchup, mustard, horseradish, vinegar, cocoa powder. Spices in moderation: allspice, basil, bay leaves, celery powder or leaves, cinnamon, cumin powder, curry powder, ginger, mace, marjoram, onion or garlic powder, oregano, paprika, parsley flakes, ground pepper, rosemary, sage, savory, tarragon, thyme, turmeric.  · Avoid: Coconut, honey.  Document Released: 01/27/2004 Document Revised: 07/31/2012 Document Reviewed: 03/22/2012  ExitCare® Patient Information ©2014 [employer].

## 2013-10-30 NOTE — Telephone Encounter (Signed)
Patient states that he is coming down with the flu. Can he have Tamiflu sent to his pharmacy? [facility]  [phone]

## 2013-10-30 NOTE — Telephone Encounter (Signed)
He needs to come in we can test for the flu and treat accordingly. Called him to advise.

## 2014-02-15 ENCOUNTER — Other Ambulatory Visit: Payer: Self-pay | Admitting: Family Medicine

## 2014-02-19 ENCOUNTER — Other Ambulatory Visit: Payer: Self-pay | Admitting: Family Medicine

## 2014-03-18 ENCOUNTER — Other Ambulatory Visit: Payer: Self-pay | Admitting: Physician Assistant

## 2014-03-19 ENCOUNTER — Ambulatory Visit (INDEPENDENT_AMBULATORY_CARE_PROVIDER_SITE_OTHER): Payer: Managed Care, Other (non HMO) | Admitting: Family Medicine

## 2014-03-19 VITALS — BP 146/78 | HR 83 | Temp 98.5°F | Resp 16 | Ht 72.0 in | Wt 226.0 lb

## 2014-03-19 DIAGNOSIS — E785 Hyperlipidemia, unspecified: Secondary | ICD-10-CM

## 2014-03-19 DIAGNOSIS — I10 Essential (primary) hypertension: Secondary | ICD-10-CM

## 2014-03-19 MED ORDER — LISINOPRIL 5 MG PO TABS
5.0000 mg | ORAL_TABLET | Freq: Every day | ORAL | Status: DC
Start: 2014-03-19 — End: 2014-12-12

## 2014-03-19 NOTE — Patient Instructions (Signed)
You should receive a call or letter about your lab results within the next week to 10 days.   We will call you to schedule a physical in the next 9 months. We can recheck fasting cholesterol test at that time.   Keep a record of your blood pressures outside of the office and bring them to the next office visit.

## 2014-03-19 NOTE — Progress Notes (Signed)
This chart was scribed for Jim IncorporatedJeffrey R. Chilton SiGreen, MD by Beverly MilchJ Harrison Collins, ED Scribe. This patient was seen in room 14 and the patient's care was started at 6:43 PM.  Subjective:    Patient ID: Jim Macadamiaarryl M Slaght, male    DOB: 23-Oct-1976, 38 y.o.   MRN: [id number]   Chief Complaint  Patient presents with  . Medication Refill    Lisinopril    HPI Shade FloodGREENE,Travas Schexnayder R, MD Colston Lorane GellM Hearty is a 38 y.o. male here for med refill. Pt reorts he ran out of his BP meds last week. His anxiety pushes his BP the most. Pt states he last ate about 4.5 hours ago.  Pt has a h/o HTN and takes lisinopril 5mg  QD. His last office visit was for a physical with me in 11/2012. Controlled at that time med refills for 1 year. Last creatinine normal at 1.24 in 11/2012. Pt reports the [facility] about a month ago. He states he doesn't check it too often. Pt states his granparents both have pacemakers but are in their 80s. He states he has an uncle who has had a heart attack, but he is also a drug user. Pt denies CP, nausea, dizziness, lightheadedness with his meds. Pt concerned about whether or not he should be checking his BP at home daily with a cuff at home.  Pt has h/o HLD. Total cholesterol 221, HDL 51, and LDL 156, again, in 11/2012. Recommended diet and exercise change and recheck in 6 months. Pt has not fasted today.  Seizure disorder followed by [facility]. Pt takes keppra. He states he hasn't had a seizure in 5 years. He reports he hasn't changed his dose of keppra.   He states he is still working for [employer] an doing well. Pt reports his daughter, Summer, is 3. Pt is a nonsmoker. He states he does not drink beer but does drink alcohol minimally.     Patient Active Problem List   Diagnosis Date Noted  . 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 Epilepsy   . Hypertension   . Anxiety     Past Surgical History    Procedure Laterality Date  . Shoulder arthroscopy w/ acromial repair  2008    No Known Allergies Prior to Admission medications   Medication Sig Start Date End Date Taking? Authorizing Provider  levETIRAcetam (KEPPRA) 750 MG tablet Take 1 tablet (750 mg total) by mouth 2 (two) times daily. 08/14/13  Yes York Spanielharles K Willis, MD  lisinopril (PRINIVIL,ZESTRIL) 5 MG tablet Take 1 tablet (5 mg total) by mouth daily. PATIENT NEEDS OFFICE VISIT FOR ADDITIONAL REFILLS   Yes Morrell RiddleSarah L Weber, PA-C  Multiple Vitamin (MULITIVITAMIN WITH MINERALS) TABS Take 1 tablet by mouth daily.   Yes Historical Provider, MD    History   Social History  . Marital Status: Married    Spouse Name: Jim Fisher     Number of Children: 1  . Years of Education: N/A   Occupational History  . SALES    Social History Main Topics  . Smoking status: Former Smoker -- 2 years    Types: Cigarettes    Quit date: 11/20/1996  . Smokeless tobacco: Never Used     Comment: SMOKED IN HIGH SCHOOL AT LEAST 3 CIGARETTES PER DAY  . Alcohol Use: No     Comment: every 2 weeks   . Drug Use: No  .  Sexual Activity: Yes     Comment: number of sex partners in the last 12 months 1   Other Topics Concern  . Not on file   Social History Narrative   Exercise doing weights and cardio 5 x/week for 1 hour.    Patient works at [employer].   Patient has some college.   Patient is married with one child.     Review of Systems  Constitutional: Negative for fatigue and unexpected weight change.  Eyes: Negative for visual disturbance.  Respiratory: Negative for cough, chest tightness and shortness of breath.   Cardiovascular: Negative for chest pain, palpitations and leg swelling.  Gastrointestinal: Negative for abdominal pain and blood in stool.  Neurological: Negative for dizziness, light-headedness and headaches.       Objective:   Physical Exam  Nursing note and vitals reviewed. Constitutional: He is oriented to person, place, and time. He  appears well-developed and well-nourished.  HENT:  Head: Normocephalic and atraumatic.  Eyes: EOM are normal. Pupils are equal, round, and reactive to light.  Neck: No JVD present. Carotid bruit is not present.  Cardiovascular: Normal rate, regular rhythm and normal heart sounds.   No murmur heard. Pulmonary/Chest: Effort normal and breath sounds normal. He has no rales.  Musculoskeletal: He exhibits no edema.  Neurological: He is alert and oriented to person, place, and time.  Skin: Skin is warm and dry.  Psychiatric: He has a normal mood and affect.     Triage Vitals: BP 146/78  Pulse 83  Temp(Src) 98.5 F (36.9 C) (Oral)  Resp 16  Ht 6' (1.829 m)  Wt 226 lb (102.513 kg)  BMI 30.64 kg/m2  SpO2 98%      Assessment & Plan:   Jim MacadamiaDarryl M Fisher is a 38 y.o. male Other and unspecified hyperlipidemia - Plan: Basic metabolic panel.  Not truly fasting tonight, but with age and prior level, would not necessarily start stain on prior levels. Recheck lipids at next ov for physical   Unspecified essential hypertension - Plan: Basic metabolic panel, lisinopril (PRINIVIL,ZESTRIL) 5 MG tablet - white coat component likely as stable outpatient. No changes in regimen. Outside BP's for next ov. Refilled for 9 months (CPE in next 6-9 months).    Meds ordered this encounter  Medications  . lisinopril (PRINIVIL,ZESTRIL) 5 MG tablet    Sig: Take 1 tablet (5 mg total) by mouth daily.    Dispense:  90 tablet    Refill:  2   Patient Instructions  You should receive a call or letter about your lab results within the next week to 10 days.   We will call you to schedule a physical in the next 9 months. We can recheck fasting cholesterol test at that time.   Keep a record of your blood pressures outside of the office and bring them to the next office visit.     I personally performed the services described in this documentation, which was scribed in my presence. The recorded information has  been reviewed and considered, and addended by me as needed.

## 2014-03-20 LAB — BASIC METABOLIC PANEL
BUN: 14 mg/dL (ref 6–23)
CALCIUM: 9.2 mg/dL (ref 8.4–10.5)
CHLORIDE: 104 meq/L (ref 96–112)
CO2: 27 mEq/L (ref 19–32)
Creat: 1.18 mg/dL (ref 0.50–1.35)
GLUCOSE: 92 mg/dL (ref 70–99)
POTASSIUM: 4.3 meq/L (ref 3.5–5.3)
SODIUM: 139 meq/L (ref 135–145)

## 2014-03-24 ENCOUNTER — Encounter: Payer: Self-pay | Admitting: Radiology

## 2014-03-25 NOTE — Progress Notes (Signed)
Scheduled for October.

## 2014-05-07 ENCOUNTER — Ambulatory Visit (INDEPENDENT_AMBULATORY_CARE_PROVIDER_SITE_OTHER): Payer: Managed Care, Other (non HMO) | Admitting: Family Medicine

## 2014-05-07 VITALS — BP 130/82 | HR 80 | Temp 98.4°F | Resp 20 | Ht 72.0 in | Wt 224.0 lb

## 2014-05-07 DIAGNOSIS — J029 Acute pharyngitis, unspecified: Secondary | ICD-10-CM

## 2014-05-07 DIAGNOSIS — R599 Enlarged lymph nodes, unspecified: Secondary | ICD-10-CM

## 2014-05-07 DIAGNOSIS — R591 Generalized enlarged lymph nodes: Secondary | ICD-10-CM

## 2014-05-07 MED ORDER — AMOXICILLIN 875 MG PO TABS: 875.0000 mg | ORAL_TABLET | Freq: Two times a day (BID) | ORAL | Status: DC

## 2014-05-07 NOTE — Progress Notes (Signed)
Subjective: Complained of sore throat.  I forgot to do dictation of hx and exam.  Objective:   Assessment: Clinical pharyngitis and lymphadenitis  Plan: Amox

## 2014-05-07 NOTE — Patient Instructions (Addendum)
Amoxicillin 875 twice daily for one week  Drink plenty of fluids  Return if worse

## 2014-07-22 ENCOUNTER — Ambulatory Visit (INDEPENDENT_AMBULATORY_CARE_PROVIDER_SITE_OTHER): Payer: Managed Care, Other (non HMO) | Admitting: Nurse Practitioner

## 2014-07-22 ENCOUNTER — Encounter: Payer: Self-pay | Admitting: Nurse Practitioner

## 2014-07-22 VITALS — BP 130/81 | HR 85 | Ht 73.0 in | Wt 222.6 lb

## 2014-07-22 DIAGNOSIS — G40309 Generalized idiopathic epilepsy and epileptic syndromes, not intractable, without status epilepticus: Secondary | ICD-10-CM

## 2014-07-22 MED ORDER — LEVETIRACETAM 750 MG PO TABS: 750.0000 mg | ORAL_TABLET | Freq: Two times a day (BID) | ORAL | Status: DC

## 2014-07-22 NOTE — Patient Instructions (Signed)
Please continue Keppra at current dose will refill Call for any seizure activity F/U yearly and prn

## 2014-07-22 NOTE — Progress Notes (Signed)
[facility]  PATIENT: Jim Fisher DOB: 1976-01-08   REASON FOR VISIT: Followup for seizure disorder   HISTORY OF PRESENT ILLNESS:Jim Fisher, 38 year old black male returns for followup. He was last seen 07/22/2013 . He has a history of seizures. This patient has done very well on Keppra, taking 750 mg twice daily. Patient is tolerating the medication very well. Patient has changed his diet, and this has improved his hypertension, he is currently on Lisinopril. Patient is operating a motor vehicle. Patient works in Airline pilot at [employer]. Last seizure 5.5 yrs ago. Denies side effects to Keppra. He returns for reevaluation he needs refills on his medication   REVIEW OF SYSTEMS: Full 14 system review of systems performed and notable only for those listed, all others are neg:  Constitutional: N/A  Cardiovascular: N/A  Ear/Nose/Throat: N/A  Skin: N/A  Eyes: N/A  Respiratory: N/A  Gastroitestinal: N/A  Hematology/Lymphatic: N/A  Endocrine: N/A Musculoskeletal:N/A  Allergy/Immunology: N/A  Neurological: N/A Psychiatric: N/A Sleep : NA   ALLERGIES: No Known Allergies  HOME MEDICATIONS: Outpatient Prescriptions Prior to Visit  Medication Sig Dispense Refill  . levETIRAcetam (KEPPRA) 750 MG tablet Take 1 tablet (750 mg total) by mouth 2 (two) times daily.  180 tablet  3  . lisinopril (PRINIVIL,ZESTRIL) 5 MG tablet Take 1 tablet (5 mg total) by mouth daily.  90 tablet  2  . Multiple Vitamin (MULITIVITAMIN WITH MINERALS) TABS Take 1 tablet by mouth daily.      Marland Kitchen amoxicillin (AMOXIL) 875 MG tablet Take 1 tablet (875 mg total) by mouth 2 (two) times daily.  14 tablet  0   No facility-administered medications prior to visit.    PAST MEDICAL HISTORY: Past Medical History  Diagnosis Date  . Epilepsy   . Hypertension   . Anxiety     PAST SURGICAL HISTORY: Past Surgical History  Procedure Laterality Date  . Shoulder arthroscopy w/ acromial repair  2008     FAMILY HISTORY: Family History  Problem Relation Age of Onset  . Hypertension Maternal Grandmother   . Diabetes Maternal Grandmother     SOCIAL HISTORY: History   Social History  . Marital Status: Married    Spouse Name: Jim Fisher     Number of Children: 1  . Years of Education: N/A   Occupational History  . SALES    Social History Main Topics  . Smoking status: Former Smoker -- 2 years    Types: Cigarettes    Quit date: 11/20/1996  . Smokeless tobacco: Never Used     Comment: SMOKED IN HIGH SCHOOL AT LEAST 3 CIGARETTES PER DAY  . Alcohol Use: No     Comment: every 2 weeks   . Drug Use: No  . Sexual Activity: Yes     Comment: number of sex partners in the last 12 months 1   Other Topics Concern  . Not on file   Social History Narrative   Exercise doing weights and cardio 5 x/week for 1 hour.    Patient works at [employer].   Patient has some college.   Patient is married with one child.      PHYSICAL EXAM  Filed Vitals:   07/22/14 0907  BP: 130/81  Pulse: 85  Height:  (1.854 m)  Weight: 222 lb 9.6 oz (100.971 kg)   Body mass index is 29.37 kg/(m^2). General: well developed, well nourished, seated, in no evident distress  Head: head normocephalic and atraumatic. Oropharynx benign  Neck:  supple with no carotid bruits  Cardiovascular: regular rate and rhythm, no murmurs  Neurologic Exam  Mental Status: Awake and fully alert. Oriented to place and time. Follows all commands. Speech and language normal.  Cranial Nerves: Pupils equal, briskly reactive to light. Extraocular movements full without nystagmus. Visual fields full to confrontation. Hearing intact and symmetric to finger snap. Face, tongue, palate move normally and symmetrically. Neck flexion and extension normal.  Motor: Normal bulk and tone. Normal strength in all tested extremity muscles.No focal weakness  Coordination: Rapid alternating movements normal in all extremities. Finger-to-nose and  heel-to-shin performed accurately bilaterally. No dysmetria  Gait and Station: Arises from chair without difficulty. Stance is normal. Gait demonstrates normal stride length and balance . Able to heel, toe and tandem walk without difficulty.  Reflexes: 2+ and symmetric. Toes downgoing.    DIAGNOSTIC DATA (LABS, IMAGING, TESTING) - I reviewed patient records, labs, notes, testing and imaging myself where available.  Lab Results  Component Value Date   WBC 8.1 10/30/2013   HGB 14.3 10/30/2013   HCT 45.8 10/30/2013   MCV 86.5 10/30/2013   PLT 308 11/25/2012      Component Value Date/Time   NA 139 03/19/2014 1906   K 4.3 03/19/2014 1906   CL 104 03/19/2014 1906   CO2 27 03/19/2014 1906   GLUCOSE 92 03/19/2014 1906   BUN 14 03/19/2014 1906   CREATININE 1.18 03/19/2014 1906   CALCIUM 9.2 03/19/2014 1906   PROT 7.4 11/25/2012 1623   ALBUMIN 4.6 11/25/2012 1623   AST 21 11/25/2012 1623   ALT 18 11/25/2012 1623   ALKPHOS 31* 11/25/2012 1623   BILITOT 0.5 11/25/2012 1623   Lab Results  Component Value Date   CHOL 221* 11/25/2012   HDL 51 11/25/2012   LDLCALC 161* 11/25/2012   TRIG 70 11/25/2012   CHOLHDL 4.3 11/25/2012    ASSESSMENT AND PLAN  38 y.o. year old male  has a past medical history of Epilepsy; and Hypertension;  here to followup. Last seizure 5.5  Years ago.   Please continue Keppra at current dose will refill Routine labs by Dr. Chestine Spore PCP Call for any seizure activity F/U yearly and prn Nilda Riggs, [facility], [facility], APRN  [facility] [address] [place], [place] [address] [phone]

## 2014-07-22 NOTE — Progress Notes (Signed)
I have read the note, and I agree with the clinical assessment and plan.  Jim Fisher   

## 2014-08-24 ENCOUNTER — Encounter: Payer: Managed Care, Other (non HMO) | Admitting: Family Medicine

## 2014-08-24 ENCOUNTER — Ambulatory Visit: Payer: Managed Care, Other (non HMO) | Admitting: Family Medicine

## 2014-08-26 ENCOUNTER — Other Ambulatory Visit: Payer: Self-pay | Admitting: Neurology

## 2014-10-12 ENCOUNTER — Other Ambulatory Visit: Payer: Self-pay

## 2014-10-12 ENCOUNTER — Encounter (HOSPITAL_COMMUNITY): Payer: Self-pay | Admitting: *Deleted

## 2014-10-12 DIAGNOSIS — I10 Essential (primary) hypertension: Secondary | ICD-10-CM | POA: Diagnosis not present

## 2014-10-12 DIAGNOSIS — Z79899 Other long term (current) drug therapy: Secondary | ICD-10-CM | POA: Diagnosis not present

## 2014-10-12 DIAGNOSIS — G40909 Epilepsy, unspecified, not intractable, without status epilepticus: Secondary | ICD-10-CM | POA: Diagnosis not present

## 2014-10-12 DIAGNOSIS — R42 Dizziness and giddiness: Secondary | ICD-10-CM | POA: Insufficient documentation

## 2014-10-12 DIAGNOSIS — Z8659 Personal history of other mental and behavioral disorders: Secondary | ICD-10-CM | POA: Diagnosis not present

## 2014-10-12 DIAGNOSIS — Z87891 Personal history of nicotine dependence: Secondary | ICD-10-CM | POA: Insufficient documentation

## 2014-10-12 LAB — URINALYSIS, ROUTINE W REFLEX MICROSCOPIC
Bilirubin Urine: NEGATIVE
GLUCOSE, UA: NEGATIVE mg/dL
HGB URINE DIPSTICK: NEGATIVE
KETONES UR: NEGATIVE mg/dL
Leukocytes, UA: NEGATIVE
Nitrite: NEGATIVE
PROTEIN: NEGATIVE mg/dL
SPECIFIC GRAVITY, URINE: 1.006 (ref 1.005–1.030)
UROBILINOGEN UA: 0.2 mg/dL (ref 0.0–1.0)
pH: 6.5 (ref 5.0–8.0)

## 2014-10-12 NOTE — ED Notes (Signed)
Pt states that he had an episode of severe dizziness 2 weeks ago with a headache; pt states that he laid down and took some Sudafed and after some time the episode passed; pt states that over the last few days he has had mild abd pain, heartburn and just not feeling himself; pt states that he was laying in the floor tonight and had an episode of dizziness; pt states that he tried to sit up to help the dizziness but it continued; pt states that he was having blurry vision when he was walking from his car to the ER tonight; pt states that he has epilepsy and felt like he was going to have a seizure at one point; pt states that he no longer feels that way but is concerned over the dizziness

## 2014-10-13 ENCOUNTER — Emergency Department (HOSPITAL_COMMUNITY)
Admission: EM | Admit: 2014-10-13 | Discharge: 2014-10-13 | Disposition: A | Discharge status: Home / Self Care | Payer: Managed Care, Other (non HMO) | Attending: Emergency Medicine | Admitting: Emergency Medicine

## 2014-10-13 ENCOUNTER — Emergency Department (HOSPITAL_COMMUNITY): Payer: Managed Care, Other (non HMO)

## 2014-10-13 DIAGNOSIS — R42 Dizziness and giddiness: Secondary | ICD-10-CM

## 2014-10-13 LAB — CBC WITH DIFFERENTIAL/PLATELET
BASOS ABS: 0 10*3/uL (ref 0.0–0.1)
Basophils Relative: 0 % (ref 0–1)
EOS PCT: 1 % (ref 0–5)
Eosinophils Absolute: 0.1 10*3/uL (ref 0.0–0.7)
HCT: 40.2 % (ref 39.0–52.0)
Hemoglobin: 13.4 g/dL (ref 13.0–17.0)
LYMPHS ABS: 2.8 10*3/uL (ref 0.7–4.0)
Lymphocytes Relative: 31 % (ref 12–46)
MCH: 26.7 pg (ref 26.0–34.0)
MCHC: 33.3 g/dL (ref 30.0–36.0)
MCV: 80.2 fL (ref 78.0–100.0)
Monocytes Absolute: 0.5 10*3/uL (ref 0.1–1.0)
Monocytes Relative: 5 % (ref 3–12)
NEUTROS PCT: 63 % (ref 43–77)
Neutro Abs: 5.6 10*3/uL (ref 1.7–7.7)
PLATELETS: 298 10*3/uL (ref 150–400)
RBC: 5.01 MIL/uL (ref 4.22–5.81)
RDW: 12.3 % (ref 11.5–15.5)
WBC: 9 10*3/uL (ref 4.0–10.5)

## 2014-10-13 LAB — COMPREHENSIVE METABOLIC PANEL WITH GFR
ALT: 18 U/L (ref 0–53)
AST: 20 U/L (ref 0–37)
Albumin: 4.1 g/dL (ref 3.5–5.2)
Alkaline Phosphatase: 32 U/L — ABNORMAL LOW (ref 39–117)
Anion gap: 13 (ref 5–15)
BUN: 15 mg/dL (ref 6–23)
CO2: 24 meq/L (ref 19–32)
Calcium: 9.5 mg/dL (ref 8.4–10.5)
Chloride: 103 meq/L (ref 96–112)
Creatinine, Ser: 1.23 mg/dL (ref 0.50–1.35)
GFR calc Af Amer: 86 mL/min — ABNORMAL LOW (ref >90)
GFR calc non Af Amer: 74 mL/min — ABNORMAL LOW (ref >90)
Glucose, Bld: 108 mg/dL — ABNORMAL HIGH (ref 70–99)
Potassium: 4.1 meq/L (ref 3.7–5.3)
Sodium: 140 meq/L (ref 137–147)
Total Bilirubin: 0.2 mg/dL — ABNORMAL LOW (ref 0.3–1.2)
Total Protein: 7.3 g/dL (ref 6.0–8.3)

## 2014-10-13 LAB — I-STAT CHEM 8, ED
BUN: 14 mg/dL (ref 6–23)
CALCIUM ION: 1.25 mmol/L — AB (ref 1.12–1.23)
CHLORIDE: 105 meq/L (ref 96–112)
Creatinine, Ser: 1.2 mg/dL (ref 0.50–1.35)
GLUCOSE: 109 mg/dL — AB (ref 70–99)
HEMATOCRIT: 44 % (ref 39.0–52.0)
HEMOGLOBIN: 15 g/dL (ref 13.0–17.0)
Potassium: 4 mEq/L (ref 3.7–5.3)
Sodium: 140 mEq/L (ref 137–147)
TCO2: 23 mmol/L (ref 0–100)

## 2014-10-13 LAB — LIPASE, BLOOD: Lipase: 21 U/L (ref 11–59)

## 2014-10-13 MED ORDER — SODIUM CHLORIDE 0.9 % IV BOLUS (SEPSIS)
1000.0000 mL | Freq: Once | INTRAVENOUS | Status: AC
  Administered 2014-10-13: 1000 mL via INTRAVENOUS

## 2014-10-13 MED ORDER — MECLIZINE HCL 50 MG PO TABS: 50.0000 mg | ORAL_TABLET | Freq: Two times a day (BID) | ORAL | Status: DC | PRN

## 2014-10-13 NOTE — ED Notes (Signed)
Resting quietly with eye closed. Easily arousable. Verbally responsive. Resp even and unlabored. ABC's intact. IV infusing NS at 999ml/hr without difficulty. NAD noted.  

## 2014-10-13 NOTE — ED Provider Notes (Signed)
CSN: [id number]     Arrival date & time 10/12/14  2148 History   First MD Initiated Contact with Patient 10/13/14 0051     Chief Complaint  Patient presents with  . Dizziness     (Consider location/radiation/quality/duration/timing/severity/associated sxs/prior Treatment) HPI Jim Fisher is a 38 y.o. male with past medical history of hypertension, epilepsy, anxiety coming in with dizziness. Patient states he had a sinus headache approximately 2 weeks ago. This was treated with Sudafed, and he believes this began his dizziness. This evening he was laying on the ground looking at his phone when he felt the room spinning. His symptoms become worse when he is moving or walking. Nothing has made his symptoms better he currently is not dizzy in the room. He thought this may have been a prodrome to a seizure, however he has not had a seizure in 5 years since he has been on Keppra. Patient is also concern for a Keppra overdose, he denies his dosages changing recently. He denies any neurological symptoms such as blurry vision, muscle weakness, numbness. Patient had no nausea or vomiting. Review systems is possible for some abdominal pain he describes as heartburn but improved with times. He also had one episode of diarrhea today. Patient has no further complaints.   10 Systems reviewed and are negative for acute change except as noted in the HPI.    Past Medical History  Diagnosis Date  . Epilepsy   . Hypertension   . Anxiety    Past Surgical History  Procedure Laterality Date  . Shoulder arthroscopy w/ acromial repair  2008   Family History  Problem Relation Age of Onset  . Hypertension Maternal Grandmother   . Diabetes Maternal Grandmother    History  Substance Use Topics  . Smoking status: Former Smoker -- 2 years    Types: Cigarettes    Quit date: 11/20/1996  . Smokeless tobacco: Never Used     Comment: SMOKED IN HIGH SCHOOL AT LEAST 3 CIGARETTES PER DAY  . Alcohol Use: No   Comment: every 2 weeks     Review of Systems    Allergies  Review of patient's allergies indicates no known allergies.  Home Medications   Prior to Admission medications   Medication Sig Start Date End Date Taking? Authorizing Provider  levETIRAcetam (KEPPRA) 750 MG tablet TAKE 1 TABLET (750 MG TOTAL) BY MOUTH 2 (TWO) TIMES DAILY. 08/26/14  Yes York Spanielharles K Willis, MD  lisinopril (PRINIVIL,ZESTRIL) 5 MG tablet Take 1 tablet (5 mg total) by mouth daily. 03/19/14  Yes Shade FloodJeffrey R Greene, MD  Multiple Vitamin (MULITIVITAMIN WITH MINERALS) TABS Take 1 tablet by mouth daily.   Yes Historical Provider, MD  Multiple Vitamins-Minerals (EMERGEN-C IMMUNE) PACK Take 1 Package by mouth daily.   Yes Historical Provider, MD  levETIRAcetam (KEPPRA) 750 MG tablet Take 1 tablet (750 mg total) by mouth 2 (two) times daily. Patient not taking: Reported on 10/13/2014 07/22/14   Nilda RiggsNancy Carolyn Martin, NP   BP 141/78 mmHg  Pulse 95  Temp(Src) 98.8 F (37.1 C) (Oral)  Resp 18  SpO2 100% Physical Exam  Constitutional: He is oriented to person, place, and time. Vital signs are normal. He appears well-developed and well-nourished.  Non-toxic appearance. He does not appear ill. No distress.  HENT:  Head: Normocephalic and atraumatic.  Nose: Nose normal.  Mouth/Throat: Oropharynx is clear and moist. No oropharyngeal exudate.  Eyes: Conjunctivae and EOM are normal. Pupils are equal, round, and reactive to light. No  scleral icterus.  Neck: Normal range of motion. Neck supple. No tracheal deviation, no edema, no erythema and normal range of motion present. No thyroid mass and no thyromegaly present.  Cardiovascular: Normal rate, regular rhythm, S1 normal, S2 normal, normal heart sounds, intact distal pulses and normal pulses.  Exam reveals no gallop and no friction rub.   No murmur heard. Pulses:      Radial pulses are 2+ on the right side, and 2+ on the left side.       Dorsalis pedis pulses are 2+ on the right side,  and 2+ on the left side.  Pulmonary/Chest: Effort normal and breath sounds normal. No respiratory distress. He has no wheezes. He has no rhonchi. He has no rales.  Abdominal: Soft. Normal appearance and bowel sounds are normal. He exhibits no distension, no ascites and no mass. There is no hepatosplenomegaly. There is no tenderness. There is no rebound, no guarding and no CVA tenderness.  Musculoskeletal: Normal range of motion. He exhibits no edema or tenderness.  Lymphadenopathy:    He has no cervical adenopathy.  Neurological: He is alert and oriented to person, place, and time. He has normal strength. No cranial nerve deficit or sensory deficit. He exhibits normal muscle tone. GCS eye subscore is 4. GCS verbal subscore is 5. GCS motor subscore is 6.  Normal strength and sensation 4 strength. Normal cerebellar and gait testing.  Skin: Skin is warm, dry and intact. No petechiae and no rash noted. He is not diaphoretic. No erythema. No pallor.  Psychiatric: He has a normal mood and affect. His behavior is normal. Judgment normal.  Nursing note and vitals reviewed.   ED Course  Procedures (including critical care time) Labs Review Labs Reviewed  COMPREHENSIVE METABOLIC PANEL - Abnormal; Notable for the following:    Glucose, Bld 108 (*)    Alkaline Phosphatase 32 (*)    Total Bilirubin 0.2 (*)    GFR calc non Af Amer 74 (*)    GFR calc Af Amer 86 (*)    All other components within normal limits  I-STAT CHEM 8, ED - Abnormal; Notable for the following:    Glucose, Bld 109 (*)    Calcium, Ion 1.25 (*)    All other components within normal limits  URINALYSIS, ROUTINE W REFLEX MICROSCOPIC  CBC WITH DIFFERENTIAL  LIPASE, BLOOD  LEVETIRACETAM LEVEL    Imaging Review Ct Head Wo Contrast  10/13/2014   CLINICAL DATA:  Dizziness.  Blurred vision.  EXAM: CT HEAD WITHOUT CONTRAST  TECHNIQUE: Contiguous axial images were obtained from the base of the skull through the vertex without  intravenous contrast.  COMPARISON:  None currently available  FINDINGS: Skull and Sinuses:No acute fracture or destructive process.  In the partially visualized upper right maxillary sinus there is mucosal thickening and probable fluid accumulation.  Orbits: No acute abnormality.  Brain: No evidence of acute abnormality, such as acute infarction, hemorrhage, hydrocephalus, or mass lesion/mass effect.  IMPRESSION: 1. Negative brain. 2. Right maxillary sinusitis.   Electronically Signed   By: Tiburcio PeaJonathan  Watts M.D.   On: 10/13/2014 01:47     EKG Interpretation None      MDM   Final diagnoses:  Dizziness    Patient since emergency department out of concern for dizziness. Will evaluate with laboratory studies as the patient is also having abdominal pain. CT of his head was ordered, he has not a CT since his initial seizure in 2001. Patient's history sounds most consistent  with vertigo.  CT of head is negative, labs are unremarkable. Her level still pending. Patient was advised he would be notified if it is severely elevated. Send home with meclizine for possible vertigo and primary care follow-up. His vital signs remain within his normal limits and patient is safe for discharge.    Tomasita Crumble, MD 10/13/14 [phone]

## 2014-10-13 NOTE — ED Notes (Signed)
Awake. Verbally responsive. Resp even and unlabored. ABC's intact. NAD noted. 

## 2014-10-13 NOTE — Discharge Instructions (Signed)
Dizziness Jim Fisher, you were seen today for dizziness. Your laboratory studies and CT scan were normal. Your Keppra level will not come back for a couple of days, you will be notified if it is abnormal. Follow-up with your primary care physician within 3 days for continued treatment. Take prescriptions as instructed. If any of your symptoms worsen come back to emergency department immediately for repeat evaluation. Thank you.   Dizziness means you feel unsteady or lightheaded. You might feel like you are going to pass out (faint). HOME CARE   Drink enough fluids to keep your pee (urine) clear or pale yellow.  Take your medicines exactly as told by your doctor. If you take blood pressure medicine, always stand up slowly from the lying or sitting position. Hold on to something to steady yourself.  If you need to stand in one place for a long time, move your legs often. Tighten and relax your leg muscles.  Have someone stay with you until you feel okay.  Do not drive or use heavy machinery if you feel dizzy.  Do not drink alcohol. GET HELP RIGHT AWAY IF:   You feel dizzy or lightheaded and it gets worse.  You feel sick to your stomach (nauseous), or you throw up (vomit).  You have trouble talking or walking.  You feel weak or have trouble using your arms, hands, or legs.  You cannot think clearly or have trouble forming sentences.  You have chest pain, belly (abdominal) pain, sweating, or you are short of breath.  Your vision changes.  You are bleeding.  You have problems from your medicine that seem to be getting worse. MAKE SURE YOU:   Understand these instructions.  Will watch your condition.  Will get help right away if you are not doing well or get worse. Document Released: 10/26/2011 Document Revised: 01/29/2012 Document Reviewed: 10/26/2011 [facility] Patient Information 2015 [place], [place]. This information is not intended to replace advice given to you by your health  care provider. Make sure you discuss any questions you have with your health care provider.  Benign Positional Vertigo Vertigo means you feel like you or your surroundings are moving when they are not. Benign positional vertigo is the most common form of vertigo. Benign means that the cause of your condition is not serious. Benign positional vertigo is more common in older adults. CAUSES  Benign positional vertigo is the result of an upset in the labyrinth system. This is an area in the middle ear that helps control your balance. This may be caused by a viral infection, head injury, or repetitive motion. However, often no specific cause is found. SYMPTOMS  Symptoms of benign positional vertigo occur when you move your head or eyes in different directions. Some of the symptoms may include:  Loss of balance and falls.  Vomiting.  Blurred vision.  Dizziness.  Nausea.  Involuntary eye movements (nystagmus). DIAGNOSIS  Benign positional vertigo is usually diagnosed by physical exam. If the specific cause of your benign positional vertigo is unknown, your caregiver may perform imaging tests, such as magnetic resonance imaging (MRI) or computed tomography (CT). TREATMENT  Your caregiver may recommend movements or procedures to correct the benign positional vertigo. Medicines such as meclizine, benzodiazepines, and medicines for nausea may be used to treat your symptoms. In rare cases, if your symptoms are caused by certain conditions that affect the inner ear, you may need surgery. HOME CARE INSTRUCTIONS   Follow your caregiver's instructions.  Move slowly. Do  not make sudden body or head movements.  Avoid driving.  Avoid operating heavy machinery.  Avoid performing any tasks that would be dangerous to you or others during a vertigo episode.  Drink enough fluids to keep your urine clear or pale yellow. SEEK IMMEDIATE MEDICAL CARE IF:   You develop problems with walking, weakness,  numbness, or using your arms, hands, or legs.  You have difficulty speaking.  You develop severe headaches.  Your nausea or vomiting continues or gets worse.  You develop visual changes.  Your family or friends notice any behavioral changes.  Your condition gets worse.  You have a fever.  You develop a stiff neck or sensitivity to light. MAKE SURE YOU:   Understand these instructions.  Will watch your condition.  Will get help right away if you are not doing well or get worse. Document Released: 08/14/2006 Document Revised: 01/29/2012 Document Reviewed: 07/27/2011 [facility]Exit[facility] Patient Information 2015 [place], [place]. This information is not intended to replace advice given to you by your health care provider. Make sure you discuss any questions you have with your health care provider.

## 2014-10-13 NOTE — ED Notes (Signed)
Resting quietly with eye closed. Easily arousable. Verbally responsive. Resp even and unlabored. ABC's intact. NAD noted.  

## 2014-10-15 LAB — LEVETIRACETAM LEVEL: Levetiracetam Lvl: 3 ug/mL

## 2014-12-12 ENCOUNTER — Other Ambulatory Visit: Payer: Self-pay | Admitting: Family Medicine

## 2015-05-07 ENCOUNTER — Emergency Department (HOSPITAL_COMMUNITY)
Admission: EM | Admit: 2015-05-07 | Discharge: 2015-05-07 | Disposition: A | Discharge status: Home / Self Care | Payer: Managed Care, Other (non HMO) | Attending: Emergency Medicine | Admitting: Emergency Medicine

## 2015-05-07 ENCOUNTER — Encounter (HOSPITAL_COMMUNITY): Payer: Self-pay | Admitting: Physical Medicine and Rehabilitation

## 2015-05-07 ENCOUNTER — Emergency Department (HOSPITAL_COMMUNITY): Payer: Managed Care, Other (non HMO)

## 2015-05-07 DIAGNOSIS — Z79899 Other long term (current) drug therapy: Secondary | ICD-10-CM | POA: Diagnosis not present

## 2015-05-07 DIAGNOSIS — I10 Essential (primary) hypertension: Secondary | ICD-10-CM | POA: Diagnosis not present

## 2015-05-07 DIAGNOSIS — Z8659 Personal history of other mental and behavioral disorders: Secondary | ICD-10-CM | POA: Diagnosis not present

## 2015-05-07 DIAGNOSIS — G40909 Epilepsy, unspecified, not intractable, without status epilepticus: Secondary | ICD-10-CM | POA: Diagnosis not present

## 2015-05-07 DIAGNOSIS — R079 Chest pain, unspecified: Secondary | ICD-10-CM | POA: Diagnosis present

## 2015-05-07 DIAGNOSIS — Z87891 Personal history of nicotine dependence: Secondary | ICD-10-CM | POA: Insufficient documentation

## 2015-05-07 LAB — COMPREHENSIVE METABOLIC PANEL
ALT: 31 U/L (ref 17–63)
AST: 22 U/L (ref 15–41)
Albumin: 4.1 g/dL (ref 3.5–5.0)
Alkaline Phosphatase: 33 U/L — ABNORMAL LOW (ref 38–126)
Anion gap: 7 (ref 5–15)
BUN: 17 mg/dL (ref 6–20)
CALCIUM: 9.7 mg/dL (ref 8.9–10.3)
CHLORIDE: 106 mmol/L (ref 101–111)
CO2: 25 mmol/L (ref 22–32)
Creatinine, Ser: 1.08 mg/dL (ref 0.61–1.24)
GFR calc Af Amer: >60 mL/min (ref >60)
GFR calc non Af Amer: >60 mL/min (ref >60)
Glucose, Bld: 98 mg/dL (ref 65–99)
POTASSIUM: 4 mmol/L (ref 3.5–5.1)
Sodium: 138 mmol/L (ref 135–145)
TOTAL PROTEIN: 7.4 g/dL (ref 6.5–8.1)
Total Bilirubin: 0.9 mg/dL (ref 0.3–1.2)

## 2015-05-07 LAB — CBC WITH DIFFERENTIAL/PLATELET
BASOS ABS: 0 10*3/uL (ref 0.0–0.1)
BASOS PCT: 0 % (ref 0–1)
Eosinophils Absolute: 0.1 10*3/uL (ref 0.0–0.7)
Eosinophils Relative: 1 % (ref 0–5)
HCT: 43.5 % (ref 39.0–52.0)
Hemoglobin: 14.8 g/dL (ref 13.0–17.0)
Lymphocytes Relative: 40 % (ref 12–46)
Lymphs Abs: 3.9 10*3/uL (ref 0.7–4.0)
MCH: 27 pg (ref 26.0–34.0)
MCHC: 34 g/dL (ref 30.0–36.0)
MCV: 79.2 fL (ref 78.0–100.0)
MONOS PCT: 8 % (ref 3–12)
Monocytes Absolute: 0.7 10*3/uL (ref 0.1–1.0)
NEUTROS PCT: 51 % (ref 43–77)
Neutro Abs: 5 10*3/uL (ref 1.7–7.7)
PLATELETS: 290 10*3/uL (ref 150–400)
RBC: 5.49 MIL/uL (ref 4.22–5.81)
RDW: 12.3 % (ref 11.5–15.5)
WBC: 9.8 10*3/uL (ref 4.0–10.5)

## 2015-05-07 LAB — I-STAT TROPONIN, ED: TROPONIN I, POC: 0 ng/mL (ref 0.00–0.08)

## 2015-05-07 MED ORDER — ASPIRIN 81 MG PO CHEW
324.0000 mg | CHEWABLE_TABLET | Freq: Once | ORAL | Status: AC
  Administered 2015-05-07: 324 mg via ORAL
  Filled 2015-05-07: qty 4

## 2015-05-07 NOTE — Discharge Instructions (Signed)
Please call your doctor for a followup appointment within 24-48 hours. When you talk to your doctor please let them know that you were seen in the emergency department and have them acquire all of your records so that they can discuss the findings with you and formulate a treatment plan to fully care for your new and ongoing problems. ° °

## 2015-05-07 NOTE — ED Provider Notes (Signed)
CSN: [id number]     Arrival date & time 05/07/15  1012 History   First MD Initiated Contact with Patient 05/07/15 1034     Chief Complaint  Patient presents with  . Chest Pain     (Consider location/radiation/quality/duration/timing/severity/associated sxs/prior Treatment) HPI Comments: Pt is a 39 y /o male with hx of htn, but no hx of heart disease, with chest pain. The chest pain has been going on for 2 months, it is intermittent, it seems to be brought on by confrontation or anxiety, he first noticed it after a argument with his spouse 2 months ago, since that time when he gets into argumentative type situations either at work or at home it recurs. Today while he was driving to work and the car he had the symptoms come back, he denies shortness of breath, swelling, denies any other pulmonary embolus risk factors, denies any other acute coronary syndrome was factors, he has not smoked in 15 years. He denies drug use, denies any family history of heart disease, he is very low risk for acute coronary syndrome.  Patient is a 39 y.o. male presenting with chest pain. The history is provided by the patient.  Chest Pain   Past Medical History  Diagnosis Date  . Epilepsy   . Hypertension   . Anxiety    Past Surgical History  Procedure Laterality Date  . Shoulder arthroscopy w/ acromial repair  2008   Family History  Problem Relation Age of Onset  . Hypertension Maternal Grandmother   . Diabetes Maternal Grandmother    History  Substance Use Topics  . Smoking status: Former Smoker -- 2 years    Types: Cigarettes    Quit date: 11/20/1996  . Smokeless tobacco: Never Used     Comment: SMOKED IN HIGH SCHOOL AT LEAST 3 CIGARETTES PER DAY  . Alcohol Use: No     Comment: every 2 weeks     Review of Systems  Cardiovascular: Positive for chest pain.  All other systems reviewed and are negative.     Allergies  Review of patient's allergies indicates no known allergies.  Home  Medications   Prior to Admission medications   Medication Sig Start Date End Date Taking? Authorizing Provider  levETIRAcetam (KEPPRA) 750 MG tablet TAKE 1 TABLET (750 MG TOTAL) BY MOUTH 2 (TWO) TIMES DAILY. 08/26/14  Yes York Spaniel, MD  lisinopril (PRINIVIL,ZESTRIL) 5 MG tablet TAKE 1 TABLET BY MOUTH EVERY DAY.  "NEEDS OFFICE VISIT FOR ADDITIONAL REFILLS" 12/14/14  Yes Chelle Jeffery, PA-C   BP 130/77 mmHg  Pulse 86  Temp(Src) 98.7 F (37.1 C) (Oral)  Resp 18  Ht 6' (1.829 m)  Wt 210 lb (95.255 kg)  BMI 28.47 kg/m2  SpO2 99% Physical Exam  Constitutional: He appears well-developed and well-nourished. No distress.  HENT:  Head: Normocephalic and atraumatic.  Mouth/Throat: Oropharynx is clear and moist. No oropharyngeal exudate.  Eyes: Conjunctivae and EOM are normal. Pupils are equal, round, and reactive to light. Right eye exhibits no discharge. Left eye exhibits no discharge. No scleral icterus.  Neck: Normal range of motion. Neck supple. No JVD present. No thyromegaly present.  Cardiovascular: Normal rate, regular rhythm, normal heart sounds and intact distal pulses.  Exam reveals no gallop and no friction rub.   No murmur heard. Pulmonary/Chest: Effort normal and breath sounds normal. No respiratory distress. He has no wheezes. He has no rales.  Abdominal: Soft. Bowel sounds are normal. He exhibits no distension and no mass.  There is no tenderness.  Musculoskeletal: Normal range of motion. He exhibits no edema or tenderness.  Lymphadenopathy:    He has no cervical adenopathy.  Neurological: He is alert. Coordination normal.  Skin: Skin is warm and dry. No rash noted. No erythema.  Psychiatric: He has a normal mood and affect. His behavior is normal.  Nursing note and vitals reviewed.   ED Course  Procedures (including critical care time) Labs Review Labs Reviewed  COMPREHENSIVE METABOLIC PANEL - Abnormal; Notable for the following:    Alkaline Phosphatase 33 (*)     All other components within normal limits  CBC WITH DIFFERENTIAL/PLATELET  Rosezena Sensor, ED    Imaging Review Dg Chest 2 View  05/07/2015   CLINICAL DATA:  Midsternal non radiating chest pressure which began yesterday.  EXAM: CHEST  2 VIEW  COMPARISON:  11/02/2010.  FINDINGS: The heart size and mediastinal contours are within normal limits. Both lungs are clear. The visualized skeletal structures are unremarkable.  IMPRESSION: No active cardiopulmonary disease.  Stable exam from priors.   Electronically Signed   By: Elsie Stain M.D.   On: 05/07/2015 11:27     EKG Interpretation   Date/Time:  Friday May 07 2015 10:21:07 EDT Ventricular Rate:  96 PR Interval:  158 QRS Duration: 84 QT Interval:  358 QTC Calculation: 452 R Axis:   107 Text Interpretation:  Normal sinus rhythm Rightward axis Borderline ECG  since last tracing no significant change Confirmed by Sitlaly Gudiel  MD, Massiel Stipp  ([id number]) on 05/07/2015 10:49:00 AM      MDM   Final diagnoses:  Chest pain, unspecified chest pain type    No signs of acute ischemia, EKG is otherwise unremarkable, check a chest x-ray and a troponin, the patient does have a slight right axis deviation but no tachycardia, pulse is 85 on arrival, no hypotension, no hypoxia. He is able to exercise routinely up until 2 months ago when he stopped exercising he was having no exertional symptoms whatsoever. He no longer does any cardiac or weight lifting activity, at this time his symptoms are minimal - no radiation and no SOB, diaphoresis or nausea.  Agreeable to trop, cxr, f/u with cards for stress if neg.  Likely anxiety.  I have personally viewed and interpreted the imaging and agree with radiologist interpretation.  No consolidation, pneumothorax or abnormal lung appearance or abnormal soft tissue appearance.  D/w Rosann Auerbach of Cardiology - she will have the office staff set pt up with appointment to have stress test.  Pt informed of all of his  results.  Meds given in ED:  Medications  aspirin chewable tablet 324 mg (324 mg Oral Given 05/07/15 1050)    New Prescriptions   No medications on file      Eber Hong, MD 05/07/15 1210

## 2015-05-07 NOTE — ED Notes (Signed)
Pt presents to department for evaluation of midsternal non radiating chest pressure. Onset yesterday while at work. Reports discomfort started after he was involved in verbal disagreement with co-worker. 5/10 midsternal chest pressure upon arrival to ED. Respirations unlabored.

## 2015-07-20 ENCOUNTER — Encounter: Payer: Self-pay | Admitting: Nurse Practitioner

## 2015-07-20 ENCOUNTER — Ambulatory Visit (INDEPENDENT_AMBULATORY_CARE_PROVIDER_SITE_OTHER): Payer: Managed Care, Other (non HMO) | Admitting: Nurse Practitioner

## 2015-07-20 VITALS — BP 128/82 | HR 78 | Ht 72.0 in | Wt 213.0 lb

## 2015-07-20 DIAGNOSIS — G40309 Generalized idiopathic epilepsy and epileptic syndromes, not intractable, without status epilepticus: Secondary | ICD-10-CM | POA: Diagnosis not present

## 2015-07-20 MED ORDER — LEVETIRACETAM 750 MG PO TABS: ORAL_TABLET | ORAL | Status: DC

## 2015-07-20 NOTE — Patient Instructions (Signed)
Please continue Keppra at current dose will refill Routine labs by Dr. Clark PCP Call for any seizure activity F/U yearly and prn  

## 2015-07-20 NOTE — Progress Notes (Signed)
[facility]  PATIENT: Jim Fisher DOB: 1976-02-07   REASON FOR VISIT: Follow-up for history of seizure disorder HISTORY FROM: Patient    HISTORY OF PRESENT ILLNESS:Jim Fisher, 39 year old black male returns for followup. He was last seen 07/22/2014 . He has a history of seizures. This patient has done very well on Keppra, taking 750 mg twice daily. Patient is tolerating the medication very well. Patient has changed his diet, and this has improved his hypertension, he is currently on Lisinopril. Patient is operating a motor vehicle. Patient works in Airline pilot at [employer]. Last seizure 6.5 yrs ago. Denies side effects to Keppra. He returns for reevaluation he needs refills on his medication     REVIEW OF SYSTEMS: Full 14 system review of systems performed and notable only for those listed, all others are neg:  Constitutional: neg  Cardiovascular: neg Ear/Nose/Throat: neg  Skin: neg Eyes: neg Respiratory: neg Gastroitestinal: neg  Hematology/Lymphatic: neg  Endocrine: neg Musculoskeletal:neg Allergy/Immunology: neg Neurological: neg Psychiatric: Periodic anxiety Sleep : neg   ALLERGIES: No Known Allergies  HOME MEDICATIONS: Outpatient Prescriptions Prior to Visit  Medication Sig Dispense Refill  . levETIRAcetam (KEPPRA) 750 MG tablet TAKE 1 TABLET (750 MG TOTAL) BY MOUTH 2 (TWO) TIMES DAILY. 180 tablet 3  . lisinopril (PRINIVIL,ZESTRIL) 5 MG tablet TAKE 1 TABLET BY MOUTH EVERY DAY.  "NEEDS OFFICE VISIT FOR ADDITIONAL REFILLS" 30 tablet 0   No facility-administered medications prior to visit.    PAST MEDICAL HISTORY: Past Medical History  Diagnosis Date  . Epilepsy   . Hypertension   . Anxiety     PAST SURGICAL HISTORY: Past Surgical History  Procedure Laterality Date  . Shoulder arthroscopy w/ acromial repair  2008    FAMILY HISTORY: Family History  Problem Relation Age of Onset  . Hypertension Maternal Grandmother   . Diabetes Maternal  Grandmother     SOCIAL HISTORY: Social History   Social History  . Marital Status: Married    Spouse Name: Jim Fisher   . Number of Children: 1  . Years of Education: N/A   Occupational History  . SALES    Social History Main Topics  . Smoking status: Former Smoker -- 2 years    Types: Cigarettes    Quit date: 11/20/1996  . Smokeless tobacco: Never Used     Comment: SMOKED IN HIGH SCHOOL AT LEAST 3 CIGARETTES PER DAY  . Alcohol Use: No     Comment: every 2 weeks   . Drug Use: No  . Sexual Activity: Yes     Comment: number of sex partners in the last 12 months 1   Other Topics Concern  . Not on file   Social History Narrative   Exercise doing weights and cardio 5 x/week for 1 hour.    Patient works at [employer].   Patient has some college.   Patient is married with one child.      PHYSICAL EXAM  Filed Vitals:   07/20/15 0724  BP: 128/82  Pulse: 78  Height: 6' (1.829 m)  Weight: 213 lb (96.616 kg)   Body mass index is 28.88 kg/(m^2). General: well developed, well nourished, seated, in no evident distress  Head: head normocephalic and atraumatic. Oropharynx benign  Neck: supple with no carotid bruits  Cardiovascular: regular rate and rhythm, no murmurs  Neurologic Exam  Mental Status: Awake and fully alert. Oriented to place and time. Follows all commands. Speech and language normal.  Cranial Nerves: Pupils equal, briskly  reactive to light. Extraocular movements full without nystagmus. Visual fields full to confrontation. Hearing intact and symmetric to finger snap. Face, tongue, palate move normally and symmetrically. Neck flexion and extension normal.  Motor: Normal bulk and tone. Normal strength in all tested extremity muscles.No focal weakness  Coordination: Rapid alternating movements normal in all extremities. Finger-to-nose and heel-to-shin performed accurately bilaterally. No dysmetria  Gait and Station: Arises from chair without difficulty. Stance is  normal. Gait demonstrates normal stride length and balance . Able to heel, toe and tandem walk without difficulty.  Reflexes: 2+ and symmetric. Toes downgoing.  DIAGNOSTIC DATA (LABS, IMAGING, TESTING) - I reviewed patient records, labs, notes, testing and imaging myself where available.  Lab Results  Component Value Date   WBC 9.8 05/07/2015   HGB 14.8 05/07/2015   HCT 43.5 05/07/2015   MCV 79.2 05/07/2015   PLT 290 05/07/2015     ASSESSMENT AND PLAN  39 y.o. year old male  has a past medical history of Epilepsy; Hypertension; and Anxiety. here to follow-up  Please continue Keppra at current dose will refill Routine labs by Dr. Chestine Spore PCP Call for any seizure activity F/U yearly and prn Nilda Riggs, [facility], [facility], APRN  [facility] [address], [address] [place], [place] [address] [phone]

## 2015-07-20 NOTE — Progress Notes (Signed)
I have read the note, and I agree with the clinical assessment and plan.  WILLIS,CHARLES KEITH   

## 2015-07-23 ENCOUNTER — Ambulatory Visit: Payer: Managed Care, Other (non HMO) | Admitting: Nurse Practitioner

## 2015-08-31 ENCOUNTER — Other Ambulatory Visit: Payer: Self-pay | Admitting: Internal Medicine

## 2015-08-31 DIAGNOSIS — R079 Chest pain, unspecified: Secondary | ICD-10-CM

## 2015-09-02 ENCOUNTER — Other Ambulatory Visit: Payer: Self-pay | Admitting: Nurse Practitioner

## 2015-09-06 ENCOUNTER — Ambulatory Visit (HOSPITAL_COMMUNITY)
Admission: RE | Admit: 2015-09-06 | Discharge: 2015-09-06 | Disposition: A | Discharge status: Home / Self Care | Payer: Managed Care, Other (non HMO) | Source: Ambulatory Visit | Attending: Internal Medicine | Admitting: Internal Medicine

## 2015-09-06 DIAGNOSIS — I340 Nonrheumatic mitral (valve) insufficiency: Secondary | ICD-10-CM | POA: Diagnosis not present

## 2015-09-06 DIAGNOSIS — R079 Chest pain, unspecified: Secondary | ICD-10-CM | POA: Insufficient documentation

## 2015-09-06 DIAGNOSIS — I517 Cardiomegaly: Secondary | ICD-10-CM | POA: Insufficient documentation

## 2015-09-06 NOTE — Progress Notes (Signed)
  Echocardiogram 2D Echocardiogram has been performed.  Arvil ChacoFoster, Eldrige Pitkin 09/06/2015, 9:34 AM

## 2016-07-18 ENCOUNTER — Ambulatory Visit (INDEPENDENT_AMBULATORY_CARE_PROVIDER_SITE_OTHER): Payer: Managed Care, Other (non HMO) | Admitting: Nurse Practitioner

## 2016-07-18 ENCOUNTER — Encounter: Payer: Self-pay | Admitting: Nurse Practitioner

## 2016-07-18 VITALS — BP 146/85 | HR 87 | Ht 72.0 in | Wt 226.8 lb

## 2016-07-18 DIAGNOSIS — G40309 Generalized idiopathic epilepsy and epileptic syndromes, not intractable, without status epilepticus: Secondary | ICD-10-CM

## 2016-07-18 MED ORDER — LEVETIRACETAM 750 MG PO TABS: ORAL_TABLET | ORAL | 3 refills | Status: DC

## 2016-07-18 NOTE — Progress Notes (Signed)
I have read the note, and I agree with the clinical assessment and plan.  Eleonora Peeler KEITH   

## 2016-07-18 NOTE — Progress Notes (Signed)
[facility]  PATIENT: STACIE KNUTZEN DOB: 1976/03/19   REASON FOR VISIT: Follow-up for history of seizure disorder HISTORY FROM: Patient    HISTORY OF PRESENT ILLNESS:Mr. Glockner, 40 year old black male returns for yearly followup.  He has a history of seizures. This patient has done very well on Keppra, taking 750 mg twice daily. Patient is tolerating the medication very well. Patient has changed his diet, and this has improved his hypertension, he is currently on Lisinopril. Patient is operating a motor vehicle. 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 Denies side effects to Keppra. He returns for reevaluation he needs refills on his medication     REVIEW OF SYSTEMS: Full 14 system review of systems performed and notable only for those listed, all others are neg:  Constitutional: neg  Cardiovascular: neg Ear/Nose/Throat: neg  Skin: neg Eyes: neg Respiratory: neg Gastroitestinal: neg  Hematology/Lymphatic: neg  Endocrine: neg Musculoskeletal:neg Allergy/Immunology: neg Neurological: neg Psychiatric: Periodic anxiety Sleep : neg   ALLERGIES: No Known Allergies  HOME MEDICATIONS: Outpatient Medications Prior to Visit  Medication Sig Dispense Refill  . levETIRAcetam (KEPPRA) 750 MG tablet TAKE 1 TABLET (750 MG TOTAL) BY MOUTH 2 (TWO) TIMES DAILY. 180 tablet 3  . lisinopril (PRINIVIL,ZESTRIL) 5 MG tablet TAKE 1 TABLET BY MOUTH EVERY DAY.  "NEEDS OFFICE VISIT FOR ADDITIONAL REFILLS" 30 tablet 0  . levETIRAcetam (KEPPRA) 750 MG tablet TAKE 1 TABLET (750 MG TOTAL) BY MOUTH 2 (TWO) TIMES DAILY. 180 tablet 3   No facility-administered medications prior to visit.     PAST MEDICAL HISTORY: Past Medical History:  Diagnosis Date  . Anxiety   . Epilepsy (HCC)   . Hypertension     PAST SURGICAL HISTORY: Past Surgical History:  Procedure Laterality Date  . SHOULDER ARTHROSCOPY W/ ACROMIAL REPAIR  2008    FAMILY HISTORY: Family  History  Problem Relation Age of Onset  . Hypertension Maternal Grandmother   . Diabetes Maternal Grandmother     SOCIAL HISTORY: Social History   Social History  . Marital status: Married    Spouse name: Diane   . Number of children: 1  . Years of education: N/A   Occupational History  . 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 Smoking status: Former Smoker    Years: 2.00    Types: Cigarettes    Quit date: 11/20/1996  . Smokeless tobacco: Never Used     Comment: SMOKED IN HIGH SCHOOL AT LEAST 3 CIGARETTES PER DAY  . Alcohol use No     Comment: every 2 weeks   . Drug use: No  . Sexual activity: Yes     Comment: number of sex partners in the last 12 months 1   Other Topics Concern  . Not on file   Social History Narrative   Exercise doing weights and cardio 5 x/week for 1 hour.    Patient works at [employer].   Patient has some college.   Patient is married with one child.      PHYSICAL EXAM  Vitals:   07/18/16 1022  BP: (!) 146/85  Pulse: 87  Weight: 226 lb 12.8 oz (102.9 kg)  Height: 6' (1.829 m)   Body mass index is 30.76 kg/m. General: well developed, well nourished, seated, in no evident distress  Head: head normocephalic and atraumatic. Oropharynx benign  Neck: supple with no carotid bruits  Cardiovascular: regular rate and rhythm, no murmurs  Neurologic Exam  Mental Status: Awake and fully alert. Oriented to place and time. Follows all commands. Speech and language normal.  Cranial Nerves: Pupils equal, briskly reactive to light. Extraocular movements full without nystagmus. Visual fields full to confrontation. Hearing intact and symmetric to finger snap. Face, tongue, palate move normally and symmetrically. Neck flexion and extension normal.  Motor: Normal bulk and tone. Normal strength in all tested extremity muscles.No focal weakness  Coordination: Rapid alternating movements normal in all extremities. Finger-to-nose and heel-to-shin  performed accurately bilaterally. No dysmetria  Gait and Station: Arises from chair without difficulty. Stance is normal. Gait demonstrates normal stride length and balance . Able to heel, toe and tandem walk without difficulty.  Reflexes: 2+ and symmetric. Toes downgoing.  DIAGNOSTIC DATA (LABS, IMAGING, TESTING) - I reviewed patient records, labs, notes, testing and imaging myself where available.  Lab Results  Component Value Date   WBC 9.8 05/07/2015   HGB 14.8 05/07/2015   HCT 43.5 05/07/2015   MCV 79.2 05/07/2015   PLT 290 05/07/2015     ASSESSMENT AND PLAN  40 y.o. year old male  has a past medical history of Anxiety; Epilepsy (HCC); and Hypertension. here to follow-up  Please continue Keppra at current dose will refill Routine labs by Dr. Chestine Sporelark PCP Call for any seizure activity F/U yearly and prn Nilda RiggsNancy Carolyn Martin, [facility] RockGNP, [facility] CampusBC, APRN  [facility] [address], [address] [place], [place] [address] [phone]

## 2016-07-18 NOTE — Patient Instructions (Signed)
Please continue Keppra at current dose will refill Routine labs by Dr. Clark PCP Call for any seizure activity F/U yearly and prn  

## 2016-09-02 ENCOUNTER — Other Ambulatory Visit: Payer: Self-pay | Admitting: Nurse Practitioner

## 2016-09-12 ENCOUNTER — Telehealth: Payer: Self-pay | Admitting: Nurse Practitioner

## 2016-09-12 NOTE — Telephone Encounter (Signed)
Spoke to pt and relayed that we could not do this as we see him for seizures.  I recommended that he call his pharmacy and relay to them the situation and they may give him tablets until Dr. Chestine Sporelark returns.  He stated he would try this.

## 2016-09-12 NOTE — Telephone Encounter (Signed)
Pt called said he needs refill for lisinopril (PRINIVIL,ZESTRIL) 5 MG tablet which is filled by PCP. He said PCP will not be back in the office until 11/2. He called the office and it referred him to the answering service and was told Dr Chestine Sporelark does not allow them to refill medication but if it was an emergency he could go to the ED. This patient wants to know if clinic will refill this medication 1 x only

## 2016-09-18 ENCOUNTER — Emergency Department (HOSPITAL_COMMUNITY)
Admission: EM | Admit: 2016-09-18 | Discharge: 2016-09-18 | Disposition: A | Discharge status: Home / Self Care | Payer: Managed Care, Other (non HMO) | Attending: Emergency Medicine | Admitting: Emergency Medicine

## 2016-09-18 ENCOUNTER — Encounter (HOSPITAL_COMMUNITY): Payer: Self-pay

## 2016-09-18 ENCOUNTER — Emergency Department (HOSPITAL_COMMUNITY): Payer: Managed Care, Other (non HMO)

## 2016-09-18 DIAGNOSIS — Z79899 Other long term (current) drug therapy: Secondary | ICD-10-CM | POA: Insufficient documentation

## 2016-09-18 DIAGNOSIS — Z87891 Personal history of nicotine dependence: Secondary | ICD-10-CM | POA: Diagnosis not present

## 2016-09-18 DIAGNOSIS — I10 Essential (primary) hypertension: Secondary | ICD-10-CM | POA: Diagnosis not present

## 2016-09-18 DIAGNOSIS — R002 Palpitations: Secondary | ICD-10-CM | POA: Diagnosis present

## 2016-09-18 HISTORY — DX: Unspecified convulsions: R56.9

## 2016-09-18 LAB — BASIC METABOLIC PANEL
Anion gap: 9 (ref 5–15)
BUN: 15 mg/dL (ref 6–20)
CO2: 25 mmol/L (ref 22–32)
Calcium: 9.9 mg/dL (ref 8.9–10.3)
Chloride: 103 mmol/L (ref 101–111)
Creatinine, Ser: 1.23 mg/dL (ref 0.61–1.24)
Glucose, Bld: 104 mg/dL — ABNORMAL HIGH (ref 65–99)
POTASSIUM: 3.9 mmol/L (ref 3.5–5.1)
SODIUM: 137 mmol/L (ref 135–145)

## 2016-09-18 LAB — CBC
HCT: 44.8 % (ref 39.0–52.0)
Hemoglobin: 14.9 g/dL (ref 13.0–17.0)
MCH: 26.6 pg (ref 26.0–34.0)
MCHC: 33.3 g/dL (ref 30.0–36.0)
MCV: 80 fL (ref 78.0–100.0)
PLATELETS: 293 10*3/uL (ref 150–400)
RBC: 5.6 MIL/uL (ref 4.22–5.81)
RDW: 12.2 % (ref 11.5–15.5)
WBC: 10.8 10*3/uL — AB (ref 4.0–10.5)

## 2016-09-18 LAB — I-STAT TROPONIN, ED: Troponin i, poc: 0 ng/mL (ref 0.00–0.08)

## 2016-09-18 NOTE — ED Provider Notes (Signed)
[facility]-EMERGENCY DEPT Provider Note   CSN: [id number] Arrival date & time: 09/18/16  1454     History   Chief Complaint No chief complaint on file.   HPI Jim Fisher is a 40 y.o. male.  The history is provided by the patient and medical records.     40 year old male with history of anxiety, epilepsy, hypertension, presenting to the ED for palpitations. Patient reports first began today while he was at work. States it has happened in the past, which his doctor felt was related to stress. He states he is continued having intermittent palpitations since this time. He denies any chest pain or shortness of breath. He denies any excessive caffeine intake, limits coffee to one cup in the morning, rarely drinks soda. States he does not drink very much water though. He did recently start taking any multivitamin, states this is all natural without any noted energy supplements. He denies any known cardiac history. States both his grandparents have history of irregular heartbeat with pacemaker placement. Patient is not a smoker. No illicit drug use. No history of thyroid issues.  Past Medical History:  Diagnosis Date  . Anxiety   . Epilepsy (HCC)   . Hypertension   . Seizures [facility](HCC)     Patient Active Problem List   Diagnosis Date Noted  . 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 SHOULDER ARTHROSCOPY W/ ACROMIAL REPAIR  2008       Home Medications    Prior to Admission medications   Medication Sig Start Date End Date Taking? Authorizing Provider  levETIRAcetam (KEPPRA) 750 MG tablet TAKE 1 TABLET (750 MG TOTAL) BY MOUTH 2 (TWO) TIMES DAILY. 07/18/16   Nilda RiggsNancy Carolyn Martin, NP  lisinopril (PRINIVIL,ZESTRIL) 5 MG tablet TAKE 1 TABLET BY MOUTH EVERY DAY.  "NEEDS OFFICE VISIT FOR ADDITIONAL REFILLS" 12/14/14   Porfirio Oarhelle Jeffery, PA-C    Family History Family History  Problem Relation Age of Onset  . Hypertension  Maternal Grandmother   . Diabetes Maternal Grandmother     Social History Social History  Substance Use Topics  . Smoking status: Former Smoker    Years: 2.00    Types: Cigarettes    Quit date: 11/20/1996  . Smokeless tobacco: Never Used     Comment: SMOKED IN HIGH SCHOOL AT LEAST 3 CIGARETTES PER DAY  . Alcohol use No     Comment: every 2 weeks      Allergies   Review of patient's allergies indicates no known allergies.   Review of Systems Review of Systems  Cardiovascular: Positive for palpitations.  All other systems reviewed and are negative.    Physical Exam Updated Vital Signs BP 144/96 (BP Location: Right Arm)   Pulse 100   Temp 98.6 F (37 C) (Oral)   Resp 18   Ht 6' (1.829 m)   Wt 100.2 kg   SpO2 100%   BMI 29.97 kg/m   Physical Exam  Constitutional: He is oriented to person, place, and time. He appears well-developed and well-nourished.  HENT:  Head: Normocephalic and atraumatic.  Mouth/Throat: Oropharynx is clear and moist.  Eyes: Conjunctivae and EOM are normal. Pupils are equal, round, and reactive to light.  Neck: Normal range of motion.  Cardiovascular: Normal rate, regular rhythm and normal heart sounds.   No murmur heard  Pulmonary/Chest: Effort normal and breath sounds normal.  Abdominal: Soft. Bowel sounds are normal.  Musculoskeletal: Normal range of motion.  No leg swelling or pitting edema, no calf tenderness or palpable cords  Neurological: He is alert and oriented to person, place, and time.  Skin: Skin is warm and dry.  Psychiatric: He has a normal mood and affect.  Nursing note and vitals reviewed.    ED Treatments / Results  Labs (all labs ordered are listed, but only abnormal results are displayed) Labs Reviewed  BASIC METABOLIC PANEL - Abnormal; Notable for the following:       Result Value   Glucose, Bld 104 (*)    All other components within normal limits  CBC - Abnormal; Notable for the following:    WBC 10.8 (*)     All other components within normal limits  I-STAT TROPOININ, ED    EKG  EKG Interpretation None       Radiology Dg Chest 2 View  Result Date: 09/18/2016 CLINICAL DATA:  Palpitations for 2 days. EXAM: CHEST  2 VIEW COMPARISON:  05/07/2015 FINDINGS: The cardiomediastinal silhouette is within normal limits. The lungs are well inflated and clear. There is no evidence of pleural effusion or pneumothorax. No acute osseous abnormality is identified. IMPRESSION: No active cardiopulmonary disease. Electronically Signed   By: Sebastian AcheAllen  Grady M.D.   On: 09/18/2016 19:31    Procedures Procedures (including critical care time)  Medications Ordered in ED Medications - No data to display   Initial Impression / Assessment and Plan / ED Course  I have reviewed the triage vital signs and the nursing notes.  Pertinent labs & imaging results that were available during my care of the patient were reviewed by me and considered in my medical decision making (see chart for details).  Clinical Course   40 year old male here with palpitations. Denies any chest pain or shortness of breath. Reports history of the same. He did have a 2-D echo about a year ago which was normal aside from some mild LVH. His EKG today is normal sinus rhythm with occasional PVCs.  Labwork is reassuring, troponin negative. Chest x-ray is clear.  Patient continues to deny any chest pain. His vitals remained stable. At this time, feels he is stable for discharge. His palpitations may be due to the PVCs he is feeling. Have lower suspicion for ACS, PE, dissection, or other acute cardiac event. He did recently start vitamin, question if this is the source. Recommended to stop this for a few days to see if symptoms improve. Also recommended to monitor caffeine intake as this may precipitate palpitations as well. He was given follow-up with cardiology, may need event monitor if symptoms persist.  Discussed plan with patient, he acknowledged  understanding and agreed with plan of care.  Return precautions given for new or worsening symptoms.  Final Clinical Impressions(s) / ED Diagnoses   Final diagnoses:  Palpitations    New Prescriptions New Prescriptions   No medications on file     Garlon HatchetLisa M Annagrace Carr, PA-C 09/18/16 1955    Doug SouSam Jacubowitz, MD 09/19/16 [phone]

## 2016-09-18 NOTE — ED Triage Notes (Signed)
Patient here with palpitations x 2 days, states that he has no CP, no shortness of breath, has started taking a new vitamin, NAD

## 2016-09-18 NOTE — Discharge Instructions (Signed)
May wish to try stopping your vitamin for about a week or so to see if palpitations improved. Continue to monitor your caffeine intake as this may precipitate further palpitations. Follow-up with cardiology if symptoms persist-- call to make appointment. Return to the ED for new or worsening symptoms-- specifically any chest pain, shortness of breath, dizziness, or weakness.

## 2016-09-22 ENCOUNTER — Ambulatory Visit (INDEPENDENT_AMBULATORY_CARE_PROVIDER_SITE_OTHER): Payer: Managed Care, Other (non HMO) | Admitting: Osteopathic Medicine

## 2016-09-22 VITALS — BP 132/82 | HR 107 | Temp 98.9°F | Resp 17 | Ht 71.5 in | Wt 220.0 lb

## 2016-09-22 DIAGNOSIS — I493 Ventricular premature depolarization: Secondary | ICD-10-CM | POA: Diagnosis not present

## 2016-09-22 LAB — TSH: TSH: 0.75 mIU/L (ref 0.40–4.50)

## 2016-09-22 NOTE — Patient Instructions (Addendum)
You should get a call about the Holter monitor and the cardiology appointment. Please let us know if you don't hear anything.   Would call your PCP and let them know that we looked at labs and EKG for ER, we added thyroid test today, I referred for cardiologist and Holter monitor - they should be getting records from us soon if they are not on the Epic computer chart system.    IF you received an x-ray today, you will receive an invoice from [facility]. Please contact [facility] at [phone] with questions or concerns regarding your invoice.   IF you received labwork today, you will receive an invoice from [facility]. Please contact [facility] at [phone] with questions or concerns regarding your invoice.   Our billing staff will not be able to assist you with questions regarding bills from these companies.  You will be contacted with the lab results as soon as they are available. The fastest way to get your results is to activate your My Chart account. Instructions are located on the last page of this paperwork. If you have not heard from us regarding the results in 2 weeks, please contact this office.

## 2016-09-22 NOTE — Progress Notes (Signed)
HPI: Jim Fisher is a 40 y.o.  male  who presents to [facility] today, 09/22/16,  for chief complaint of:  Chief Complaint  Patient presents with  . patient states he has PVC's     . Context: recent ER visit for intermittent chest pains, PVS on EKG, was told to f/u w/ PCP but cannot get in to see that person for a few days. No significant stress changes, minimal caffeine. Previous w/u for CP w/ Echo showed mild LVH but otherwise normal. Requests referral to cardiology . Location: chest . Quality: sharp, twinge . Duration: 5 days ago started, 3-4 days ago went to the ER.  . Timing: intermittent, random . Modifying factors: Happens more with eating and with walking around but can also happen with rest  . Assoc signs/symptoms: No SOB.     Past medical, surgical, social and family history reviewed: Past Medical History:  Diagnosis Date  . Anxiety   . Epilepsy ([place])   . Hypertension   . Seizures ([place])    Past Surgical History:  Procedure Laterality Date  . SHOULDER ARTHROSCOPY W/ ACROMIAL REPAIR  2008   Social History  Substance Use Topics  . Smoking status: Former Smoker    Years: 2.00    Types: Cigarettes    Quit date: 11/20/1996  . Smokeless tobacco: Never Used     Comment: SMOKED IN HIGH SCHOOL AT LEAST 3 CIGARETTES PER DAY  . Alcohol use No     Comment: every 2 weeks    Family History  Problem Relation Age of Onset  . Hypertension Maternal Grandmother   . Diabetes Maternal Grandmother      Current medication list and allergy/intolerance information reviewed:   Current Outpatient Prescriptions  Medication Sig Dispense Refill  . levETIRAcetam (KEPPRA) 750 MG tablet TAKE 1 TABLET (750 MG TOTAL) BY MOUTH 2 (TWO) TIMES DAILY. 180 tablet 3  . lisinopril (PRINIVIL,ZESTRIL) 5 MG tablet TAKE 1 TABLET BY MOUTH EVERY DAY.  "NEEDS OFFICE VISIT FOR ADDITIONAL REFILLS" 30 tablet 0   No current facility-administered medications for this visit.     No Known Allergies    Review of Systems:  Constitutional:  No  fever, no chills, No recent illness  HEENT: No  headache, no vision change,  Cardiac: +chest pain as per HPI, No  pressure, No palpitations, No  Orthopnea,  Respiratory:  No  shortness of breath. No  Cough  Gastrointestinal: No  abdominal pain, No  nausea  Musculoskeletal: No new myalgia/arthralgia  Skin: No  Rash  Neurologic: No  weakness, No  dizziness   Exam:  BP 132/82 (BP Location: Right Arm, Patient Position: Sitting, Cuff Size: Normal)   Pulse (!) 107   Temp 98.9 F (37.2 C) (Oral)   Resp 17   Ht 5' 11.5" (1.816 m)   Wt 220 lb (99.8 kg)   SpO2 98%   BMI 30.26 kg/m   Constitutional: VS see above. General Appearance: alert, well-developed, well-nourished, NAD  Eyes: Normal lids and conjunctive, non-icteric sclera  Ears, Nose, Mouth, Throat: MMM, Normal external inspection ears/nares/mouth/lips/gums.   Neck: No masses, trachea midline. No thyroid enlargement.   Respiratory: Normal respiratory effort. no wheeze, no rhonchi, no rales  Cardiovascular: S1/S2 normal, no murmur, no rub/gallop auscultated. RRR. No lower extremity edema.   Musculoskeletal: Gait normal.   Neurological: Normal balance/coordination. No tremor.   Skin: warm, dry, intact.    Psychiatric: Normal judgment/insight. Normal mood and affect. Oriented x3.  Recent Results (from the past 2160 hour(s))  Basic metabolic panel     Status: Abnormal   Collection Time: 09/18/16  3:10 PM  Result Value Ref Range   Sodium 137 135 - 145 mmol/L   Potassium 3.9 3.5 - 5.1 mmol/L   Chloride 103 101 - 111 mmol/L   CO2 25 22 - 32 mmol/L   Glucose, Bld 104 (H) 65 - 99 mg/dL   BUN 15 6 - 20 mg/dL   Creatinine, Ser 1.23 0.61 - 1.24 mg/dL   Calcium 9.9 8.9 - 10.3 mg/dL   GFR calc non Af Amer >60 >60 mL/min   GFR calc Af Amer >60 >60 mL/min    Comment: (NOTE) The eGFR has been calculated using the CKD EPI equation. This calculation  has not been validated in all clinical situations. eGFR's persistently <60 mL/min signify possible Chronic Kidney Disease.    Anion gap 9 5 - 15  CBC     Status: Abnormal   Collection Time: 09/18/16  3:10 PM  Result Value Ref Range   WBC 10.8 (H) 4.0 - 10.5 K/uL   RBC 5.60 4.22 - 5.81 MIL/uL   Hemoglobin 14.9 13.0 - 17.0 g/dL   HCT 44.8 39.0 - 52.0 %   MCV 80.0 78.0 - 100.0 fL   MCH 26.6 26.0 - 34.0 pg   MCHC 33.3 30.0 - 36.0 g/dL   RDW 12.2 11.5 - 15.5 %   Platelets 293 150 - 400 K/uL  I-stat troponin, ED     Status: None   Collection Time: 09/18/16  3:30 PM  Result Value Ref Range   Troponin i, poc 0.00 0.00 - 0.08 ng/mL   Comment 3            Comment: Due to the release kinetics of cTnI, a negative result within the first hours of the onset of symptoms does not rule out myocardial infarction with certainty. If myocardial infarction is still suspected, repeat the test at appropriate intervals.     Dg Chest 2 View  Result Date: 09/18/2016 CLINICAL DATA:  Palpitations for 2 days. EXAM: CHEST  2 VIEW COMPARISON:  05/07/2015 FINDINGS: The cardiomediastinal silhouette is within normal limits. The lungs are well inflated and clear. There is no evidence of pleural effusion or pneumothorax. No acute osseous abnormality is identified. IMPRESSION: No active cardiopulmonary disease. Electronically Signed   By: Logan Bores M.D.   On: 09/18/2016 19:31   EKG personally reviewed from ER, obvious PVC  ASSESSMENT/PLAN:   Advised can refer to cardio, will get Holter in the meantime but doesn't sound like he's going into runs of tachycardia but ER precautions were reviewed  PVC (premature ventricular contraction) - Plan: EKG 12-Lead, Holter monitor - 48 hour, Ambulatory referral to Cardiology, TSH, CANCELED: TSH    Patient Instructions   You should get a call about the Holter monitor and the cardiology appointment. Please let us know if you don't hear anything.         Visit  summary with medication list and pertinent instructions was printed for patient to review. All questions at time of visit were answered - patient instructed to contact office with any additional concerns. ER/RTC precautions were reviewed with the patient. Follow-up plan: Return if symptoms worsen or fail to improve, otherwise as directed with your PCP.

## 2016-10-09 ENCOUNTER — Ambulatory Visit (INDEPENDENT_AMBULATORY_CARE_PROVIDER_SITE_OTHER): Payer: Managed Care, Other (non HMO) | Admitting: Cardiology

## 2016-10-09 ENCOUNTER — Encounter: Payer: Self-pay | Admitting: Cardiology

## 2016-10-09 VITALS — BP 132/80 | HR 80 | Ht 72.0 in | Wt 220.8 lb

## 2016-10-09 DIAGNOSIS — I493 Ventricular premature depolarization: Secondary | ICD-10-CM | POA: Diagnosis not present

## 2016-10-09 NOTE — Progress Notes (Signed)
disease    Electrophysiology Office Note   Date:  10/09/2016   ID:  Jim Fisher, DOB [birth year], MRN [id number]  PCP:  Laurena SlimmerLARK,PRESTON S, MD  Primary Electrophysiologist:  Regan LemmingWill Martin Ahad Colarusso, MD    Chief Complaint  Patient presents with  . New Patient (Initial Visit)    PVC's     History of Present Illness: Jim Fisher is a 40 y.o. male who presents today for electrophysiology evaluation.   Presented to the ER with palpitations. The patient again while he was at work. They've happened in this fat in the past, but his primary doctor thought that they were due to stress. He drinks one cup of coffee in the mornings but has no more caffeine than that.  In the emergency room, echocardiogram showed occasional PVCs. Lab work showed no major abnormalities and chest x-ray was clear.   Since then, he has done well. He is noted that he has more episodes of palpitations when he is feeling anxious. Prior to his episode that brought him to the emergency room, he did have 2 glasses of wine the night before. Otherwise he feels well without any major complaint.   Today, he denies symptoms of palpitations, chest pain, shortness of breath, orthopnea, PND, lower extremity edema, claudication, dizziness, presyncope, syncope, bleeding, or neurologic sequela. The patient is tolerating medications without difficulties and is otherwise without complaint today.    Past Medical History:  Diagnosis Date  . Anxiety   . Epilepsy ([facility])   . Hypertension   . Seizures ([facility])    Past Surgical History:  Procedure Laterality Date  . SHOULDER ARTHROSCOPY W/ ACROMIAL REPAIR  2008     Current Outpatient Prescriptions  Medication Sig Dispense Refill  . levETIRAcetam (KEPPRA) 750 MG tablet TAKE 1 TABLET (750 MG TOTAL) BY MOUTH 2 (TWO) TIMES DAILY. 180 tablet 3  . lisinopril (PRINIVIL,ZESTRIL) 5 MG tablet TAKE 1 TABLET BY MOUTH EVERY DAY.  "NEEDS OFFICE VISIT FOR ADDITIONAL REFILLS" 30 tablet 0   No current  facility-administered medications for this visit.     Allergies:   Patient has no known allergies.   Social History:  The patient  reports that he quit smoking about 19 years ago. His smoking use included Cigarettes. He quit after 2.00 years of use. He has never used smokeless tobacco. He reports that he does not drink alcohol or use drugs.   Family History:  The patient's family history includes Diabetes in his maternal grandmother; Hypertension in his maternal grandmother.    ROS:  Please see the history of present illness.   Otherwise, review of systems is positive for palpitations.   All other systems are reviewed and negative.    PHYSICAL EXAM: VS:  BP 132/80   Pulse 80   Ht 6' (1.829 m)   Wt 220 lb 12.8 oz (100.2 kg)   BMI 29.95 kg/m  , BMI Body mass index is 29.95 kg/m. GEN: Well nourished, well developed, in no acute distress  HEENT: normal  Neck: no JVD, carotid bruits, or masses Cardiac: RRR; no murmurs, rubs, or gallops,no edema  Respiratory:  clear to auscultation bilaterally, normal work of breathing GI: soft, nontender, nondistended, + BS MS: no deformity or atrophy  Skin: warm and dry Neuro:  Strength and sensation are intact Psych: euthymic mood, full affect  EKG:  EKG is ordered today. Personal review of the ekg ordered shows sinus rhythm, PVC (V4 transition)  Recent Labs: 09/18/2016: BUN 15; Creatinine, Ser 1.23; Hemoglobin  14.9; Platelets 293; Potassium 3.9; Sodium 137 09/22/2016: TSH 0.75    Lipid Panel     Component Value Date/Time   CHOL 221 (H) 11/25/2012 1623   TRIG 70 11/25/2012 1623   HDL 51 11/25/2012 1623   CHOLHDL 4.3 11/25/2012 1623   VLDL 14 11/25/2012 1623   LDLCALC 156 (H) 11/25/2012 1623     Wt Readings from Last 3 Encounters:  10/09/16 220 lb 12.8 oz (100.2 kg)  09/22/16 220 lb (99.8 kg)  09/18/16 221 lb (100.2 kg)      Other studies Reviewed: Additional studies/ records that were reviewed today include: TTE 09/06/15    Review of the above records today demonstrates:  - Left ventricle: The cavity size was normal. Wall thickness was   increased in a pattern of mild LVH. Systolic function was normal.   The estimated ejection fraction was in the range of 60% to 65%.   Wall motion was normal; there were no regional wall motion   abnormalities. Left ventricular diastolic function parameters   were normal. - Aortic valve: Transvalvular velocity was within the normal range.   There was no stenosis. There was no regurgitation. - Aortic root: The aortic root was normal in size. - Mitral valve: Transvalvular velocity was within the normal range.   There was no evidence for stenosis. There was trivial   regurgitation. - Left atrium: The atrium was mildly dilated. - Right ventricle: The cavity size was normal. Wall thickness was   normal. Systolic function was normal. - Atrial septum: No defect or patent foramen ovale was identified. - Pulmonary arteries: PA peak pressure: 39 mm Hg (S). - Inferior vena cava: The vessel was normal in size. The   respirophasic diameter changes were blunted (< 50%), consistent   with normal central venous pressure.   ASSESSMENT AND PLAN:  1.  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 These are usually nonmalignant PVCs but do not lead to any significant issues down the road. His EKG today shows very few PVCs. I have reassured him that these are likely not dangerous and that we can just monitor them. As he says that he does not feel like he is having many we do not need a Holter monitor at this time. We Kmari Brian follow him up in 6 months to reassess.  2. Hypertension: Well-controlled on lisinopril  Current medicines are reviewed at length with the patient today.   The patient does not have concerns regarding his medicines.  The following changes were made today:  none  Labs/ tests ordered today include:  No orders of the defined types were  placed in this encounter.    Disposition:   FU with Ethelene Closser 6 months  Signed, Imri Lor Jorja LoaMartin Lilyrose Tanney, MD  10/09/2016 3:18 PM     [facility] [address] [place] [phone] (office) [phone] (fax)

## 2016-10-09 NOTE — Patient Instructions (Addendum)
Medication Instructions:  Your physician recommends that you continue on your current medications as directed. Please refer to the Current Medication list given to you today.   Labwork: None Ordered   Testing/Procedures: None Ordered   Follow-Up: Cancel appointment for event monitor.  Your physician wants you to follow-up in:  6 months with Dr. Elberta Fortisamnitz. You will receive a reminder letter in the mail two months in advance. If you don't receive a letter, please call our office to schedule the follow-up appointment.   Any Other Special Instructions Will Be Listed Below (If Applicable).     If you need a refill on your cardiac medications before your next appointment, please call your pharmacy.

## 2016-10-10 ENCOUNTER — Ambulatory Visit: Payer: Managed Care, Other (non HMO) | Admitting: Cardiology

## 2017-05-02 ENCOUNTER — Ambulatory Visit (INDEPENDENT_AMBULATORY_CARE_PROVIDER_SITE_OTHER): Payer: 59 | Admitting: Emergency Medicine

## 2017-05-02 ENCOUNTER — Ambulatory Visit (INDEPENDENT_AMBULATORY_CARE_PROVIDER_SITE_OTHER): Payer: 59

## 2017-05-02 ENCOUNTER — Encounter: Payer: Self-pay | Admitting: Emergency Medicine

## 2017-05-02 VITALS — BP 130/78 | HR 71 | Temp 97.8°F | Resp 16 | Ht 71.0 in | Wt 211.6 lb

## 2017-05-02 DIAGNOSIS — R059 Cough, unspecified: Secondary | ICD-10-CM

## 2017-05-02 DIAGNOSIS — J209 Acute bronchitis, unspecified: Secondary | ICD-10-CM

## 2017-05-02 DIAGNOSIS — R05 Cough: Secondary | ICD-10-CM | POA: Diagnosis not present

## 2017-05-02 MED ORDER — AZITHROMYCIN 250 MG PO TABS: ORAL_TABLET | ORAL | 0 refills | Status: DC

## 2017-05-02 NOTE — Patient Instructions (Addendum)
     IF you received an x-ray today, you will receive an invoice from [facility]. Please contact [facility] at [phone] with questions or concerns regarding your invoice.   IF you received labwork today, you will receive an invoice from [facility]. Please contact [facility] at [phone] with questions or concerns regarding your invoice.   Our billing staff will not be able to assist you with questions regarding bills from these companies.  You will be contacted with the lab results as soon as they are available. The fastest way to get your results is to activate your My Chart account. Instructions are located on the last page of this paperwork. If you have not heard from us regarding the results in 2 weeks, please contact this office.      Acute Bronchitis, Adult Acute bronchitis is when air tubes (bronchi) in the lungs suddenly get swollen. The condition can make it hard to breathe. It can also cause these symptoms:  A cough.  Coughing up clear, yellow, or green mucus.  Wheezing.  Chest congestion.  Shortness of breath.  A fever.  Body aches.  Chills.  A sore throat.  Follow these instructions at home: Medicines  Take over-the-counter and prescription medicines only as told by your doctor.  If you were prescribed an antibiotic medicine, take it as told by your doctor. Do not stop taking the antibiotic even if you start to feel better. General instructions  Rest.  Drink enough fluids to keep your pee (urine) clear or pale yellow.  Avoid smoking and secondhand smoke. If you smoke and you need help quitting, ask your doctor. Quitting will help your lungs heal faster.  Use an inhaler, cool mist vaporizer, or humidifier as told by your doctor.  Keep all follow-up visits as told by your doctor. This is important. How is this prevented? To lower your risk of getting this condition again:  Wash your hands often with soap and water. If you cannot  use soap and water, use hand sanitizer.  Avoid contact with people who have cold symptoms.  Try not to touch your hands to your mouth, nose, or eyes.  Make sure to get the flu shot every year.  Contact a doctor if:  Your symptoms do not get better in 2 weeks. Get help right away if:  You cough up blood.  You have chest pain.  You have very bad shortness of breath.  You become dehydrated.  You faint (pass out) or keep feeling like you are going to pass out.  You keep throwing up (vomiting).  You have a very bad headache.  Your fever or chills gets worse. This information is not intended to replace advice given to you by your health care provider. Make sure you discuss any questions you have with your health care provider. Document Released: 04/24/2008 Document Revised: 06/14/2016 Document Reviewed: 04/26/2016 Elsevier Interactive Patient Education  2017 [employer]  

## 2017-05-02 NOTE — Progress Notes (Signed)
Jim Fisher 41 y.o.   Chief Complaint  Patient presents with  . Cough    sick 1 1/2 wks ago now coughing up brown phlegm-phlegm started out gren, pt having night sweats and pain in left collar bone that radiates down to sternum pt thinks pain could be from cough    HISTORY OF PRESENT ILLNESS: This is a 41 y.o. male complaining of feeling sick x 10 days; cough has turned productive of green phlegm.  Cough  This is a new problem. The current episode started 1 to 4 weeks ago. The problem has been gradually worsening. The problem occurs every few minutes. The cough is productive of purulent sputum and productive of brown sputum. Associated symptoms include chest pain, a fever, headaches, nasal congestion, a sore throat and shortness of breath. Pertinent negatives include no chills, eye redness, hemoptysis, myalgias, rash, weight loss or wheezing.     Prior to Admission medications   Medication Sig Start Date End Date Taking? Authorizing Provider  levETIRAcetam (KEPPRA) 750 MG tablet TAKE 1 TABLET (750 MG TOTAL) BY MOUTH 2 (TWO) TIMES DAILY. 07/18/16  Yes Jim Riggs, NP  lisinopril (PRINIVIL,ZESTRIL) 5 MG tablet TAKE 1 TABLET BY MOUTH EVERY DAY.  "NEEDS OFFICE VISIT FOR ADDITIONAL REFILLS" 12/14/14  Yes Jim Oar, PA-C    No Known Allergies  Patient Active Problem List   Diagnosis Date Noted  . PVC (premature ventricular contraction) 09/22/2016  . 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 Anxiety   . Epilepsy (HCC)   . Hypertension   . Seizures (HCC)     Past Surgical History:  Procedure Laterality Date  . SHOULDER ARTHROSCOPY W/ ACROMIAL REPAIR  2008    Social History   Social History  . Marital status: Married    Spouse name: Jim Fisher   . Number of children: 1  . Years of education: N/A   Occupational History  . 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 Smoking status: Former Smoker   Years: 2.00    Types: Cigarettes    Quit date: 11/20/1996  . Smokeless tobacco: Never Used     Comment: SMOKED IN HIGH SCHOOL AT LEAST 3 CIGARETTES PER DAY  . Alcohol use No     Comment: every 2 weeks   . Drug use: No  . Sexual activity: Yes     Comment: number of sex partners in the last 12 months 1   Other Topics Concern  . Not on file   Social History Narrative   Exercise doing weights and cardio 5 x/week for 1 hour.    Patient works at [employer].   Patient has some college.   Patient is married with one child.     Family History  Problem Relation Age of Onset  . Hypertension Maternal Grandmother   . Diabetes Maternal Grandmother      Review of Systems  Constitutional: Positive for fever. Negative for chills and weight loss.  HENT: Positive for congestion and sore throat. Negative for nosebleeds.   Eyes: Negative for discharge and redness.  Respiratory: Positive for cough, sputum production and shortness of breath. Negative for hemoptysis and wheezing.   Cardiovascular: Positive for chest pain. Negative for palpitations and leg swelling.  Gastrointestinal: Negative for abdominal pain, diarrhea, nausea and vomiting.  Genitourinary: Negative.  Negative for dysuria and hematuria.  Musculoskeletal: Negative.  Negative for myalgias and neck pain.  Skin: Negative for rash.  Neurological: Positive for headaches. Negative for dizziness, sensory change and focal weakness.  Endo/Heme/Allergies: Negative.   All other systems reviewed and are negative.  Vitals:   05/02/17 1335  BP: 130/78  Pulse: 71  Resp: 16  Temp: 97.8 F (36.6 C)     Physical Exam  Constitutional: He is oriented to person, place, and time. He appears well-developed and well-nourished.  HENT:  Head: Normocephalic and atraumatic.  Right Ear: External ear normal.  Left Ear: External ear normal.  Nose: Nose normal.  Mouth/Throat: Oropharynx is clear and moist. No oropharyngeal exudate.  Eyes: Conjunctivae  and EOM are normal. Pupils are equal, round, and reactive to light.  Neck: Normal range of motion. Neck supple. No JVD present. No thyromegaly present.  Cardiovascular: Normal rate, regular rhythm, normal heart sounds and intact distal pulses.   Pulmonary/Chest: Effort normal and breath sounds normal.  Abdominal: Soft. Bowel sounds are normal. He exhibits no distension. There is no tenderness.  Musculoskeletal: Normal range of motion.  Lymphadenopathy:    He has no cervical adenopathy.  Neurological: He is alert and oriented to person, place, and time. No sensory deficit. He exhibits normal muscle tone.  Skin: Skin is warm and dry. Capillary refill takes less than 2 seconds. No rash noted.  Psychiatric: He has a normal mood and affect. His behavior is normal.  Vitals reviewed.  O2Sat: 100% WNL CXR reviewed by me: NAD Dg Chest 2 View  Result Date: 05/02/2017 CLINICAL DATA:  One week of cough EXAM: CHEST  2 VIEW COMPARISON:  Chest x-ray of September 18, 2016 FINDINGS: The lungs are mildly hyperinflated. There is no focal infiltrate. There is no pleural effusion. The heart and pulmonary vascularity are normal. The mediastinum is normal in width. The bony thorax exhibits no acute abnormality. IMPRESSION: There is no pneumonia, CHF, nor other acute cardiopulmonary abnormality. Electronically Signed   By: Jim  Fisher M.D.   On: 05/02/2017 14:04     ASSESSMENT & PLAN: Jim Fisher was seen today for cough.  Diagnoses and all orders for this visit:  Acute bronchitis, unspecified organism  Cough -     DG Chest 2 View; Future  Other orders -     azithromycin (ZITHROMAX) 250 MG tablet; Sig as indicated    Patient Instructions       IF you received an x-ray today, you will receive an invoice from [facility]. Please contact [facility] at [phone] with questions or concerns regarding your invoice.   IF you received labwork today, you will receive an invoice from  [place]. Please contact [facility] at [phone] with questions or concerns regarding your invoice.   Our billing staff will not be able to assist you with questions regarding bills from these companies.  You will be contacted with the lab results as soon as they are available. The fastest way to get your results is to activate your My Chart account. Instructions are located on the last page of this paperwork. If you have not heard from [place] regarding the results in 2 weeks, please contact this office.     Acute Bronchitis, Adult Acute bronchitis is when air tubes (bronchi) in the lungs suddenly get swollen. The condition can make it hard to breathe. It can also cause these symptoms:  A cough.  Coughing up clear, yellow, or green mucus.  Wheezing.  Chest congestion.  Shortness of breath.  A fever.  Body aches.  Chills.  A sore throat.  Follow these instructions at home: Medicines  Take over-the-counter and prescription medicines only as told by your doctor.  If you were prescribed an antibiotic medicine, take it as told by your doctor. Do not stop taking the antibiotic even if you start to feel better. General instructions  Rest.  Drink enough fluids to keep your pee (urine) clear or pale yellow.  Avoid smoking and secondhand smoke. If you smoke and you need help quitting, ask your doctor. Quitting will help your lungs heal faster.  Use an inhaler, cool mist vaporizer, or humidifier as told by your doctor.  Keep all follow-up visits as told by your doctor. This is important. How is this prevented? To lower your risk of getting this condition again:  Wash your hands often with soap and water. If you cannot use soap and water, use hand sanitizer.  Avoid contact with people who have cold symptoms.  Try not to touch your hands to your mouth, nose, or eyes.  Make sure to get the flu shot every year.  Contact a doctor if:  Your symptoms do not get better in 2  weeks. Get help right away if:  You cough up blood.  You have chest pain.  You have very bad shortness of breath.  You become dehydrated.  You faint (pass out) or keep feeling like you are going to pass out.  You keep throwing up (vomiting).  You have a very bad headache.  Your fever or chills gets worse. This information is not intended to replace advice given to you by your health care provider. Make sure you discuss any questions you have with your health care provider. Document Released: 04/24/2008 Document Revised: 06/14/2016 Document Reviewed: 04/26/2016 Elsevier Interactive Patient Education  2017 Elsevier Inc.     Edwina BarthMiguel Kyden Potash, MD [facility]

## 2017-06-07 ENCOUNTER — Ambulatory Visit: Payer: Managed Care, Other (non HMO) | Admitting: Cardiology

## 2017-06-19 ENCOUNTER — Ambulatory Visit (INDEPENDENT_AMBULATORY_CARE_PROVIDER_SITE_OTHER): Payer: 59 | Admitting: Cardiology

## 2017-06-19 ENCOUNTER — Encounter: Payer: Self-pay | Admitting: Cardiology

## 2017-06-19 VITALS — BP 140/88 | HR 81 | Ht 71.0 in | Wt 212.0 lb

## 2017-06-19 DIAGNOSIS — I493 Ventricular premature depolarization: Secondary | ICD-10-CM | POA: Diagnosis not present

## 2017-06-19 DIAGNOSIS — I10 Essential (primary) hypertension: Secondary | ICD-10-CM

## 2017-06-19 MED ORDER — LISINOPRIL 10 MG PO TABS: 10.0000 mg | ORAL_TABLET | Freq: Every day | ORAL | 3 refills | Status: DC

## 2017-06-19 NOTE — Progress Notes (Signed)
disease    Electrophysiology Office Note   Date:  06/19/2017   ID:  Jim MacadamiaDarryl M Old, DOB September 17, 1976, MRN [id number]  PCP:  Laurena Slimmerlark, Preston S, MD  Primary Electrophysiologist:  Will Jorja LoaMartin Camnitz, MD    Chief Complaint  Patient presents with  . Follow-up    PVC's     History of Present Illness: Cristal Lorane GellM Sesma is a 41 y.o. male who presents today for electrophysiology evaluation.   Presented to the ER with palpitations. The patient again while he was at work. They've happened in this fat in the past, but his primary doctor thought that they were due to stress. He drinks one cup of coffee in the mornings but has no more caffeine than that.  In the emergency room, echocardiogram showed occasional PVCs. Lab work showed no major abnormalities and chest x-ray was clear.   Today, denies symptoms of palpitations, chest pain, shortness of breath, orthopnea, PND, lower extremity edema, claudication, dizziness, presyncope, syncope, bleeding, or neurologic sequela. The patient is tolerating medications without difficulties and is otherwise without complaint today. He has had no further episodes of palpitations. He is concerned that his blood pressure has been elevated at times, greater than 140. He does note that at times when he stands up, he does get dizzy. This only occurs with changing positions and is occasional.   Past Medical History:  Diagnosis Date  . Anxiety   . Epilepsy (HCC)   . Hypertension   . Seizures (HCC)    Past Surgical History:  Procedure Laterality Date  . SHOULDER ARTHROSCOPY W/ ACROMIAL REPAIR  2008     Current Outpatient Prescriptions  Medication Sig Dispense Refill  . levETIRAcetam (KEPPRA) 750 MG tablet TAKE 1 TABLET (750 MG TOTAL) BY MOUTH 2 (TWO) TIMES DAILY. 180 tablet 3  . lisinopril (PRINIVIL,ZESTRIL) 5 MG tablet TAKE 1 TABLET BY MOUTH EVERY DAY.  "NEEDS OFFICE VISIT FOR ADDITIONAL REFILLS" 30 tablet 0   No current facility-administered medications for  this visit.     Allergies:   Patient has no known allergies.   Social History:  The patient  reports that he quit smoking about 20 years ago. His smoking use included Cigarettes. He quit after 2.00 years of use. He has never used smokeless tobacco. He reports that he does not drink alcohol or use drugs.   Family History:  The patient's family history includes Diabetes in his maternal grandmother; Hypertension in his maternal grandmother.    ROS:  Please see the history of present illness.   Otherwise, review of systems is positive for none.   All other systems are reviewed and negative.   PHYSICAL EXAM: VS:  BP 140/88   Pulse 81   Ht 5\' 11"  (1.803 m)   Wt 212 lb (96.2 kg)   BMI 29.57 kg/m  , BMI Body mass index is 29.57 kg/m. GEN: Well nourished, well developed, in no acute distress  HEENT: normal  Neck: no JVD, carotid bruits, or masses Cardiac: RRR; no murmurs, rubs, or gallops,no edema  Respiratory:  clear to auscultation bilaterally, normal work of breathing GI: soft, nontender, nondistended, + BS MS: no deformity or atrophy  Skin: warm and dry Neuro:  Strength and sensation are intact Psych: euthymic mood, full affect  EKG:  EKG is ordered today. Personal review of the ekg ordered shows sinus rhythm, rate 81   Recent Labs: 09/18/2016: BUN 15; Creatinine, Ser 1.23; Hemoglobin 14.9; Platelets 293; Potassium 3.9; Sodium 137 09/22/2016: TSH 0.75  Lipid Panel     Component Value Date/Time   CHOL 221 (H) 11/25/2012 1623   TRIG 70 11/25/2012 1623   HDL 51 11/25/2012 1623   CHOLHDL 4.3 11/25/2012 1623   VLDL 14 11/25/2012 1623   LDLCALC 156 (H) 11/25/2012 1623     Wt Readings from Last 3 Encounters:  06/19/17 212 lb (96.2 kg)  05/02/17 211 lb 9.6 oz (96 kg)  10/09/16 220 lb 12.8 oz (100.2 kg)      Other studies Reviewed: Additional studies/ records that were reviewed today include: TTE 09/06/15  Review of the above records today demonstrates:  - Left  ventricle: The cavity size was normal. Wall thickness was   increased in a pattern of mild LVH. Systolic function was normal.   The estimated ejection fraction was in the range of 60% to 65%.   Wall motion was normal; there were no regional wall motion   abnormalities. Left ventricular diastolic function parameters   were normal. - Aortic valve: Transvalvular velocity was within the normal range.   There was no stenosis. There was no regurgitation. - Aortic root: The aortic root was normal in size. - Mitral valve: Transvalvular velocity was within the normal range.   There was no evidence for stenosis. There was trivial   regurgitation. - Left atrium: The atrium was mildly dilated. - Right ventricle: The cavity size was normal. Wall thickness was   normal. Systolic function was normal. - Atrial septum: No defect or patent foramen ovale was identified. - Pulmonary arteries: PA peak pressure: 39 mm Hg (S). - Inferior vena cava: The vessel was normal in size. The   respirophasic diameter changes were blunted (< 50%), consistent   with normal central venous pressure.   ASSESSMENT AND PLAN:  1.  PVCs: Currently not having symptoms with his PVCs. It appears that his PVC burden has decreased. He has decreased his caffeine intake. No further management at this time. He will call us back if PVCs recur.  2. Hypertension: Blood pressure mildly elevated today. We'll increase lisinopril to 10 mg  3. Dizziness: Likely due to occasional orthostasis. Continue to monitor.    Current medicines are reviewed at length with the patient today.   The patient does not have concerns regarding his medicines.  The following changes were made today:  Increase lisinopril  Labs/ tests ordered today include:  No orders of the defined types were placed in this encounter.    Disposition:   FU with Will Camnitz PRN  Signed, Will Jorja LoaMartin Camnitz, MD  06/19/2017 11:54 AM     [facility] [address] [place] [address] [phone] (office) [phone] (fax)

## 2017-06-19 NOTE — Patient Instructions (Signed)
Medication Instructions:  Your physician has recommended you make the following change in your medication: 1. INCREASE Lisinopril to 10 mg daily  If you need a refill on your cardiac medications before your next appointment, please call your pharmacy.   Labwork: None ordered  Testing/Procedures: None ordered  Follow-Up: No follow up is needed at this time with Dr. Elberta Fortisamnitz.  He will see you on an as needed basis.  Thank you for choosing [facility]!!   Dory HornSherri Freddie Nghiem, RN [phone]  Any Other Special Instructions Will Be Listed Below (If Applicable).

## 2017-07-17 NOTE — Progress Notes (Signed)
[facility]  PATIENT: Jim Fisher DOB: 09-10-1976   REASON FOR VISIT: Follow-up for history of seizure disorder HISTORY FROM: Patient    HISTORY OF PRESENT ILLNESS:Mr. Mansel, 41 year old black male returns for yearly followup.  He has a history of seizures. This patient has done very well on Keppra, taking 750 mg twice daily. Patient is tolerating the medication very well. Patient has changed his diet, and this has improved his hypertension, he is currently on Lisinopril. He was recently evaluated for palpitations by cardiology. He also has occasional orthostasis  Patient is operating a motor vehicle. Patient works in Airline pilot at [employer]. Last seizure 8.5 yrs ago. Denies side effects to Keppra. He returns for reevaluation he needs refills on his medication     REVIEW OF SYSTEMS: Full 14 system review of systems performed and notable only for those listed, all others are neg:  Constitutional: neg  Cardiovascular: neg Ear/Nose/Throat: neg  Skin: neg Eyes: neg Respiratory: neg Gastroitestinal: neg  Hematology/Lymphatic: neg  Endocrine: neg Musculoskeletal:neg Allergy/Immunology: neg Neurological: neg Psychiatric: Periodic anxiety Sleep : neg   ALLERGIES: No Known Allergies  HOME MEDICATIONS: Outpatient Medications Prior to Visit  Medication Sig Dispense Refill  . levETIRAcetam (KEPPRA) 750 MG tablet TAKE 1 TABLET (750 MG TOTAL) BY MOUTH 2 (TWO) TIMES DAILY. 180 tablet 3  . lisinopril (PRINIVIL,ZESTRIL) 10 MG tablet Take 1 tablet (10 mg total) by mouth daily. 90 tablet 3   No facility-administered medications prior to visit.     PAST MEDICAL HISTORY: Past Medical History:  Diagnosis Date  . Anxiety   . Epilepsy (HCC)   . Hypertension   . Palpitations   . Seizures (HCC)     PAST SURGICAL HISTORY: Past Surgical History:  Procedure Laterality Date  . SHOULDER ARTHROSCOPY W/ ACROMIAL REPAIR  2008    FAMILY HISTORY: Family History  Problem  Relation Age of Onset  . Hypertension Maternal Grandmother   . Diabetes Maternal Grandmother     SOCIAL HISTORY: Social History   Social History  . Marital status: Married    Spouse name: Diane   . Number of children: 1  . Years of education: N/A   Occupational History  . 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 Smoking status: Former Smoker    Years: 2.00    Types: Cigarettes    Quit date: 11/20/1996  . Smokeless tobacco: Never Used     Comment: SMOKED IN HIGH SCHOOL AT LEAST 3 CIGARETTES PER DAY  . Alcohol use No     Comment: every 2 weeks   . Drug use: No  . Sexual activity: Yes     Comment: number of sex partners in the last 12 months 1   Other Topics Concern  . Not on file   Social History Narrative   Exercise doing weights and cardio 5 x/week for 1 hour.    Patient works at [employer].   Patient has some college.   Patient is married with one child.      PHYSICAL EXAM  Vitals:   07/18/17 0928  BP: 130/80   Pulse: 85  Weight: 210 lb 8 oz (95.5 kg)  Height: 5\' 11"  (1.803 m)   Body mass index is 29.36 kg/m. General: well developed, well nourished, seated, in no evident distress  Head: head normocephalic and atraumatic. Oropharynx benign  Neck: supple with no carotid bruits  Cardiovascular: regular rate and rhythm, no murmurs  Neurologic Exam  Mental Status:  Awake and fully alert. Oriented to place and time. Follows all commands. Speech and language normal.  Cranial Nerves: Pupils equal, briskly reactive to light. Extraocular movements full without nystagmus. Visual fields full to confrontation. Hearing intact and symmetric to finger snap. Face, tongue, palate move normally and symmetrically. Neck flexion and extension normal.  Motor: Normal bulk and tone. Normal strength in all tested extremity muscles.No focal weakness  Coordination: Rapid alternating movements normal in all extremities. Finger-to-nose and heel-to-shin performed accurately  bilaterally. No dysmetria  Gait and Station: Arises from chair without difficulty. Stance is normal. Gait demonstrates normal stride length and balance . Able to heel, toe and tandem walk without difficulty.  Reflexes: 2+ and symmetric. Toes downgoing.  DIAGNOSTIC DATA (LABS, IMAGING, TESTING) - I reviewed patient records, labs, notes, testing and imaging myself where available.  Lab Results  Component Value Date   WBC 10.8 (H) 09/18/2016   HGB 14.9 09/18/2016   HCT 44.8 09/18/2016   MCV 80.0 09/18/2016   PLT 293 09/18/2016     ASSESSMENT AND PLAN  42 y.o. year old male  has a past medical history of Anxiety; Epilepsy (HCC); Hypertension; Palpitations; and Seizures (HCC). here to follow-up. The patient is a current patient of Dr. Anne Hahn   who is out of the office today . This note is sent to the work in doctor.     Please continue Keppra at current dose will refill Routine labs by Dr. Chestine Spore PCP Call for any seizure activity F/U yearly and prn Nilda Riggs, [facility], [facility], APRN  [facility] [address] [place], [place] [address] [phone]

## 2017-07-18 ENCOUNTER — Encounter: Payer: Self-pay | Admitting: Nurse Practitioner

## 2017-07-18 ENCOUNTER — Encounter (INDEPENDENT_AMBULATORY_CARE_PROVIDER_SITE_OTHER): Payer: Self-pay

## 2017-07-18 ENCOUNTER — Ambulatory Visit (INDEPENDENT_AMBULATORY_CARE_PROVIDER_SITE_OTHER): Payer: 59 | Admitting: Nurse Practitioner

## 2017-07-18 VITALS — BP 130/80 | HR 85 | Ht 71.0 in | Wt 210.5 lb

## 2017-07-18 DIAGNOSIS — G40309 Generalized idiopathic epilepsy and epileptic syndromes, not intractable, without status epilepticus: Secondary | ICD-10-CM | POA: Diagnosis not present

## 2017-07-18 MED ORDER — LEVETIRACETAM 750 MG PO TABS: ORAL_TABLET | ORAL | 3 refills | Status: DC

## 2017-07-18 NOTE — Patient Instructions (Signed)
Please continue Keppra at current dose will refill Routine labs by Dr. Chestine Spore PCP Call for any seizure activity F/U yearly and prn

## 2017-07-19 NOTE — Progress Notes (Signed)
I agree with the above plan 

## 2018-02-25 ENCOUNTER — Encounter: Payer: Self-pay | Admitting: Family Medicine

## 2018-02-25 ENCOUNTER — Ambulatory Visit (INDEPENDENT_AMBULATORY_CARE_PROVIDER_SITE_OTHER): Payer: BLUE CROSS/BLUE SHIELD | Admitting: Family Medicine

## 2018-02-25 ENCOUNTER — Other Ambulatory Visit: Payer: Self-pay

## 2018-02-25 VITALS — BP 140/80 | HR 85 | Temp 98.0°F | Ht 72.0 in | Wt 214.0 lb

## 2018-02-25 DIAGNOSIS — Z1329 Encounter for screening for other suspected endocrine disorder: Secondary | ICD-10-CM

## 2018-02-25 DIAGNOSIS — Z13220 Encounter for screening for lipoid disorders: Secondary | ICD-10-CM | POA: Diagnosis not present

## 2018-02-25 DIAGNOSIS — Z0000 Encounter for general adult medical examination without abnormal findings: Secondary | ICD-10-CM | POA: Diagnosis not present

## 2018-02-25 DIAGNOSIS — I10 Essential (primary) hypertension: Secondary | ICD-10-CM

## 2018-02-25 DIAGNOSIS — Z808 Family history of malignant neoplasm of other organs or systems: Secondary | ICD-10-CM | POA: Diagnosis not present

## 2018-02-25 DIAGNOSIS — Z131 Encounter for screening for diabetes mellitus: Secondary | ICD-10-CM

## 2018-02-25 DIAGNOSIS — R221 Localized swelling, mass and lump, neck: Secondary | ICD-10-CM

## 2018-02-25 DIAGNOSIS — G40909 Epilepsy, unspecified, not intractable, without status epilepticus: Secondary | ICD-10-CM | POA: Diagnosis not present

## 2018-02-25 DIAGNOSIS — Z130 Encounter for screening for diseases of the blood and blood-forming organs and certain disorders involving the immune mechanism: Secondary | ICD-10-CM

## 2018-02-25 MED ORDER — LISINOPRIL 10 MG PO TABS: 10.0000 mg | ORAL_TABLET | Freq: Every day | ORAL | 1 refills | Status: DC

## 2018-02-25 NOTE — Patient Instructions (Addendum)
Keep a record of your blood pressures outside of the office and if running over 140/90 - return to discuss options.   I would check a thyroid test, as well as order an ultrasound given your family history and possible slight fullness in the neck.  That is likely due to the normal muscle on the sides of the neck, but will let you know if there are any concerns on ultrasound.  Other screening tests will be back within the next 2 weeks.  Thank you for coming in today.  Follow-up in 6 months for blood pressure.  Keeping you healthy  Get these tests  Blood pressure- Have your blood pressure checked once a year by your healthcare provider.  Normal blood pressure is 120/80.  Weight- Have your body mass index (BMI) calculated to screen for obesity.  BMI is a measure of body fat based on height and weight. You can also calculate your own BMI at https://www.west-esparza.com/.  Cholesterol- Have your cholesterol checked regularly starting at age 20, sooner may be necessary if you have diabetes, high blood pressure, if a family member developed heart diseases at an early age or if you smoke.   Chlamydia, HIV, and other sexual transmitted disease- Get screened each year until the age of 50 then within three months of each new sexual partner.  Diabetes- Have your blood sugar checked regularly if you have high blood pressure, high cholesterol, a family history of diabetes or if you are overweight.  Get these vaccines  Flu shot- Every fall.  Tetanus shot- Every 10 years.  Menactra- Single dose; prevents meningitis.  Take these steps  Don't smoke- If you do smoke, ask your healthcare provider about quitting. For tips on how to quit, go to www.smokefree.gov or call 1-800-QUIT-NOW.  Be physically active- Exercise 5 days a week for at least 30 minutes.  If you are not already physically active start slow and gradually work up to 30 minutes of moderate physical activity.  Examples of moderate activity  include walking briskly, mowing the yard, dancing, swimming bicycling, etc.  Eat a healthy diet- Eat a variety of healthy foods such as fruits, vegetables, low fat milk, low fat cheese, yogurt, lean meats, poultry, fish, beans, tofu, etc.  For more information on healthy eating, go to www.thenutritionsource.org  Drink alcohol in moderation- Limit alcohol intake two drinks or less a day.  Never drink and drive.  Dentist- Brush and floss teeth twice daily; visit your dentis twice a year.  Depression-Your emotional health is as important as your physical health.  If you're feeling down, losing interest in things you normally enjoy please talk with your healthcare provider.  Gun Safety- If you keep a gun in your home, keep it unloaded and with the safety lock on.  Bullets should be stored separately.  Helmet use- Always wear a helmet when riding a motorcycle, bicycle, rollerblading or skateboarding.  Safe sex- If you may be exposed to a sexually transmitted infection, use a condom  Seat belts- Seat bels can save your life; always wear one.  Smoke/Carbon Monoxide detectors- These detectors need to be installed on the appropriate level of your home.  Replace batteries at least once a year.  Skin Cancer- When out in the sun, cover up and use sunscreen SPF 15 or higher.  Violence- If anyone is threatening or hurting you, please tell your healthcare provider.  Hypertension Hypertension, commonly called high blood pressure, is when the force of blood pumping through the arteries is  too strong. The arteries are the blood vessels that carry blood from the heart throughout the body. Hypertension forces the heart to work harder to pump blood and may cause arteries to become narrow or stiff. Having untreated or uncontrolled hypertension can cause heart attacks, strokes, kidney disease, and other problems. A blood pressure reading consists of a higher number over a lower number. Ideally, your blood  pressure should be below 120/80. The first ("top") number is called the systolic pressure. It is a measure of the pressure in your arteries as your heart beats. The second ("bottom") number is called the diastolic pressure. It is a measure of the pressure in your arteries as the heart relaxes. What are the causes? The cause of this condition is not known. What increases the risk? Some risk factors for high blood pressure are under your control. Others are not. Factors you can change  Smoking.  Having type 2 diabetes mellitus, high cholesterol, or both.  Not getting enough exercise or physical activity.  Being overweight.  Having too much fat, sugar, calories, or salt (sodium) in your diet.  Drinking too much alcohol. Factors that are difficult or impossible to change  Having chronic kidney disease.  Having a family history of high blood pressure.  Age. Risk increases with age.  Race. You may be at higher risk if you are African-American.  Gender. Men are at higher risk than women before age 40. After age 165, women are at higher risk than men.  Having obstructive sleep apnea.  Stress. What are the signs or symptoms? Extremely high blood pressure (hypertensive crisis) may cause:  Headache.  Anxiety.  Shortness of breath.  Nosebleed.  Nausea and vomiting.  Severe chest pain.  Jerky movements you cannot control (seizures).  How is this diagnosed? This condition is diagnosed by measuring your blood pressure while you are seated, with your arm resting on a surface. The cuff of the blood pressure monitor will be placed directly against the skin of your upper arm at the level of your heart. It should be measured at least twice using the same arm. Certain conditions can cause a difference in blood pressure between your right and left arms. Certain factors can cause blood pressure readings to be lower or higher than normal (elevated) for a short period of time:  When your  blood pressure is higher when you are in a health care provider's office than when you are at home, this is called white coat hypertension. Most people with this condition do not need medicines.  When your blood pressure is higher at home than when you are in a health care provider's office, this is called masked hypertension. Most people with this condition may need medicines to control blood pressure.  If you have a high blood pressure reading during one visit or you have normal blood pressure with other risk factors:  You may be asked to return on a different day to have your blood pressure checked again.  You may be asked to monitor your blood pressure at home for 1 week or longer.  If you are diagnosed with hypertension, you may have other blood or imaging tests to help your health care provider understand your overall risk for other conditions. How is this treated? This condition is treated by making healthy lifestyle changes, such as eating healthy foods, exercising more, and reducing your alcohol intake. Your health care provider may prescribe medicine if lifestyle changes are not enough to get your blood pressure  under control, and if:  Your systolic blood pressure is above 130.  Your diastolic blood pressure is above 80.  Your personal target blood pressure may vary depending on your medical conditions, your age, and other factors. Follow these instructions at home: Eating and drinking  Eat a diet that is high in fiber and potassium, and low in sodium, added sugar, and fat. An example eating plan is called the DASH (Dietary Approaches to Stop Hypertension) diet. To eat this way: ? Eat plenty of fresh fruits and vegetables. Try to fill half of your plate at each meal with fruits and vegetables. ? Eat whole grains, such as whole wheat pasta, brown rice, or whole grain bread. Fill about one quarter of your plate with whole grains. ? Eat or drink low-fat dairy products, such as skim  milk or low-fat yogurt. ? Avoid fatty cuts of meat, processed or cured meats, and poultry with skin. Fill about one quarter of your plate with lean proteins, such as fish, chicken without skin, beans, eggs, and tofu. ? Avoid premade and processed foods. These tend to be higher in sodium, added sugar, and fat.  Reduce your daily sodium intake. Most people with hypertension should eat less than 1,500 mg of sodium a day.  Limit alcohol intake to no more than 1 drink a day for nonpregnant women and 2 drinks a day for men. One drink equals 12 oz of beer, 5 oz of wine, or 1 oz of hard liquor. Lifestyle  Work with your health care provider to maintain a healthy body weight or to lose weight. Ask what an ideal weight is for you.  Get at least 30 minutes of exercise that causes your heart to beat faster (aerobic exercise) most days of the week. Activities may include walking, swimming, or biking.  Include exercise to strengthen your muscles (resistance exercise), such as pilates or lifting weights, as part of your weekly exercise routine. Try to do these types of exercises for 30 minutes at least 3 days a week.  Do not use any products that contain nicotine or tobacco, such as cigarettes and e-cigarettes. If you need help quitting, ask your health care provider.  Monitor your blood pressure at home as told by your health care provider.  Keep all follow-up visits as told by your health care provider. This is important. Medicines  Take over-the-counter and prescription medicines only as told by your health care provider. Follow directions carefully. Blood pressure medicines must be taken as prescribed.  Do not skip doses of blood pressure medicine. Doing this puts you at risk for problems and can make the medicine less effective.  Ask your health care provider about side effects or reactions to medicines that you should watch for. Contact a health care provider if:  You think you are having a  reaction to a medicine you are taking.  You have headaches that keep coming back (recurring).  You feel dizzy.  You have swelling in your ankles.  You have trouble with your vision. Get help right away if:  You develop a severe headache or confusion.  You have unusual weakness or numbness.  You feel faint.  You have severe pain in your chest or abdomen.  You vomit repeatedly.  You have trouble breathing. Summary  Hypertension is when the force of blood pumping through your arteries is too strong. If this condition is not controlled, it may put you at risk for serious complications.  Your personal target blood pressure may  vary depending on your medical conditions, your age, and other factors. For most people, a normal blood pressure is less than 120/80.  Hypertension is treated with lifestyle changes, medicines, or a combination of both. Lifestyle changes include weight loss, eating a healthy, low-sodium diet, exercising more, and limiting alcohol. This information is not intended to replace advice given to you by your health care provider. Make sure you discuss any questions you have with your health care provider. Document Released: 11/06/2005 Document Revised: 10/04/2016 Document Reviewed: 10/04/2016 Elsevier Interactive Patient Education  2018 [employer].    IF you received an x-ray today, you will receive an invoice from [facility]. Please contact [facility] at [phone] with questions or concerns regarding your invoice.   IF you received labwork today, you will receive an invoice from [facility]. Please contact [facility] at [phone] with questions or concerns regarding your invoice.   Our billing staff will not be able to assist you with questions regarding bills from these companies.  You will be contacted with the lab results as soon as they are available. The fastest way to get your results is to activate your My Chart account. Instructions  are located on the last page of this paperwork. If you have not heard from [place] regarding the results in 2 weeks, please contact this office.

## 2018-02-25 NOTE — Progress Notes (Signed)
Subjective:  By signing my name below, I, Jim Fisher, attest that this documentation has been prepared under the direction and in the presence of Shade Flood, MD Electronically Signed: Charline Bills, ED Scribe 02/25/2018 at 10:49 AM.   Patient ID: Jim Fisher, male    DOB: 21-Jan-1976, 42 y.o.   MRN: [id number]  Chief Complaint  Patient presents with  . Annual Exam    CPE   HPI Jim Fisher is a 42 y.o. male who presents to Primary Care at [facility] for an annual exam.  H/o PVCs Evaluated by cardiology. Last visit with [facility] July 2018. F/u only as needed. Pt suspects that heart palpitations were due to a vitamin that he started but denies any since stopping vitamin.  HTN Lisinopril increased to 10 mg in July at cardiology visit. 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 Denies cp, lightheadedness, dizziness, sob, cough, HA, any other side-effects. BP Readings from Last 3 Encounters:  02/25/18 140/80  07/18/17 130/80  06/19/17 140/88   Lab Results  Component Value Date   CREATININE 1.23 09/18/2016   Seizure Disorder Followed by neuro. Takes Keppra 750 mg bid. Seen by neuro Aug 2018. No seizure in over 8 yrs.  Family Hx Thyroid: both half sisters have h/o thyroid CA diagnosed at ages 56 and 38. Pt denies neck soreness or nodules in the neck. No other CA in the family.  Immunizations Immunization History  Administered Date(s) Administered  . Influenza Split 11/25/2012  . Tdap 07/28/2011   Depression Screening Depression screen [facility] 2/9 02/25/2018 05/02/2017 09/22/2016  Decreased Interest 0 0 0  Down, Depressed, Hopeless 0 0 0  PHQ - 2 Score 0 0 0     Visual Acuity Screening   Right eye Left eye Both eyes  Without correction:     With correction: 20/20 20/25 20/15    Dentist: has not seen dentist in a while Exercise: was exercising daily until last wk  STI  Screening: No recent STI testing including HIV. Last STI testing when his daughter Summer was born ~7 yrs ago, reports non-reactive HIV screening at that time. Denies new sexual partners since last screening; in a monogamous relationship.  Patient Active Problem List   Diagnosis Date Noted  . Cough 05/02/2017  . Acute bronchitis 05/02/2017  . PVC (premature ventricular contraction) 09/22/2016  . 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 Anxiety   . Epilepsy (HCC)   . Hypertension   . Palpitations   . Seizures (HCC)    Past Surgical History:  Procedure Laterality Date  . SHOULDER ARTHROSCOPY W/ ACROMIAL REPAIR  2008   No Known Allergies Prior to Admission medications   Medication Sig Start Date End Date Taking? Authorizing Provider  levETIRAcetam (KEPPRA) 750 MG tablet TAKE 1 TABLET (750 MG TOTAL) BY MOUTH 2 (TWO) TIMES DAILY. 07/18/17   Nilda Riggs, NP  lisinopril (PRINIVIL,ZESTRIL) 10 MG tablet Take 1 tablet (10 mg total) by mouth daily. 06/19/17   Regan Lemming, MD   Social History   Socioeconomic History  . Marital status: Married    Spouse name: Diane   . Number of children: 1  . Years of education: Not on file  . Highest education level: Not on file  Occupational History  . Occupation: Investment banker, corporate: [employer]  Social Needs  .  Financial resource strain: Not on file  . Food insecurity:    Worry: Not on file    Inability: Not on file  . Transportation needs:    Medical: Not on file    Non-medical: Not on file  Tobacco Use  . Smoking status: Former Smoker    Years: 2.00    Types: Cigarettes    Last attempt to quit: 11/20/1996    Years since quitting: 21.2  . Smokeless tobacco: Never Used  . Tobacco comment: SMOKED IN HIGH SCHOOL AT LEAST 3 CIGARETTES PER DAY  Substance and Sexual Activity  . Alcohol use: No    Alcohol/week: 0.6 oz    Types: 1 Cans of beer per week    Comment: every 2 weeks     . Drug use: No  . Sexual activity: Yes    Comment: number of sex partners in the last 12 months 1  Lifestyle  . Physical activity:    Days per week: Not on file    Minutes per session: Not on file  . Stress: Not on file  Relationships  . Social connections:    Talks on phone: Not on file    Gets together: Not on file    Attends religious service: Not on file    Active member of club or organization: Not on file    Attends meetings of clubs or organizations: Not on file    Relationship status: Not on file  . Intimate partner violence:    Fear of current or ex partner: Not on file    Emotionally abused: Not on file    Physically abused: Not on file    Forced sexual activity: Not on file  Other Topics Concern  . Not on file  Social History Narrative   Exercise doing weights and cardio 5 x/week for 1 hour.    Patient works at [employer].   Patient has some college.   Patient is married with one child.    Review of Systems  Respiratory: Negative for cough and shortness of breath.   Cardiovascular: Negative for chest pain.  Musculoskeletal: Negative for neck pain.  Neurological: Negative for dizziness, light-headedness and headaches.  Hematological: Negative for adenopathy.      Objective:   Physical Exam  Constitutional: He is oriented to person, place, and time. He appears well-developed and well-nourished.  HENT:  Head: Normocephalic and atraumatic.  Right Ear: External ear normal.  Left Ear: External ear normal.  Mouth/Throat: Oropharynx is clear and moist.  Eyes: Pupils are equal, round, and reactive to light. Conjunctivae and EOM are normal.  Neck: Normal range of motion. Neck supple. No thyromegaly present.  Possible slight fullness of thyroid without nodules.  Cardiovascular: Normal rate, regular rhythm, normal heart sounds and intact distal pulses.  Pulmonary/Chest: Effort normal and breath sounds normal. No respiratory distress. He has no wheezes.  Abdominal: Soft.  He exhibits no distension. There is no tenderness.  Musculoskeletal: Normal range of motion. He exhibits no edema or tenderness.  Lymphadenopathy:    He has no cervical adenopathy.  Neurological: He is alert and oriented to person, place, and time. He has normal reflexes.  Skin: Skin is warm and dry.  Psychiatric: He has a normal mood and affect. His behavior is normal.  Vitals reviewed.  Vitals:   02/25/18 1026  BP: 140/80  Pulse: 85  Temp: 98 F (36.7 C)  TempSrc: Oral  SpO2: 98%  Weight: 214 lb (97.1 kg)  Height: 6' (1.829 m)  Assessment & Plan:    Jim Fisher is a 42 y.o. male Annual physical exam  - -anticipatory guidance as below in AVS, screening labs above. Health maintenance items as above in HPI discussed/recommended as applicable.   Screening for thyroid disorder - Plan: TSH, US Soft Tissue Head/Neck  Screening for diabetes mellitus - Plan: Comprehensive metabolic panel  Screening for hyperlipidemia - Plan: Lipid panel  Screening, anemia, deficiency, iron - Plan: CBC  Essential hypertension - Plan: Comprehensive metabolic panel, lisinopril (PRINIVIL,ZESTRIL) 10 MG tablet  -Borderline but stable, continue same dose lisinopril, monitor home readings.   Seizure disorder (HCC)  -Stable on current dose of Keppra, continue follow-up with neuro  Fullness of neck - Plan: US Soft Tissue Head/Neck Family history of thyroid cancer - Plan: US Soft Tissue Head/Neck  -Multiple siblings with thyroid cancer, slight fullness of neck, without apparent nodules.  Will screen with thyroid ultrasound and TSH.  Meds ordered this encounter  Medications  . lisinopril (PRINIVIL,ZESTRIL) 10 MG tablet    Sig: Take 1 tablet (10 mg total) by mouth daily.    Dispense:  90 tablet    Refill:  1   Patient Instructions    Keep a record of your blood pressures outside of the office and if running over 140/90 - return to discuss options.   I would check a thyroid test, as well  as order an ultrasound given your family history and possible slight fullness in the neck.  That is likely due to the normal muscle on the sides of the neck, but will let you know if there are any concerns on ultrasound.  Other screening tests will be back within the next 2 weeks.  Thank you for coming in today.  Follow-up in 6 months for blood pressure.  Keeping you healthy  Get these tests  Blood pressure- Have your blood pressure checked once a year by your healthcare provider.  Normal blood pressure is 120/80.  Weight- Have your body mass index (BMI) calculated to screen for obesity.  BMI is a measure of body fat based on height and weight. You can also calculate your own BMI at https://www.west-esparza.com/.  Cholesterol- Have your cholesterol checked regularly starting at age 78, sooner may be necessary if you have diabetes, high blood pressure, if a family member developed heart diseases at an early age or if you smoke.   Chlamydia, HIV, and other sexual transmitted disease- Get screened each year until the age of 22 then within three months of each new sexual partner.  Diabetes- Have your blood sugar checked regularly if you have high blood pressure, high cholesterol, a family history of diabetes or if you are overweight.  Get these vaccines  Flu shot- Every fall.  Tetanus shot- Every 10 years.  Menactra- Single dose; prevents meningitis.  Take these steps  Don't smoke- If you do smoke, ask your healthcare provider about quitting. For tips on how to quit, go to www.smokefree.gov or call 1-800-QUIT-NOW.  Be physically active- Exercise 5 days a week for at least 30 minutes.  If you are not already physically active start slow and gradually work up to 30 minutes of moderate physical activity.  Examples of moderate activity include walking briskly, mowing the yard, dancing, swimming bicycling, etc.  Eat a healthy diet- Eat a variety of healthy foods such as fruits, vegetables, low fat  milk, low fat cheese, yogurt, lean meats, poultry, fish, beans, tofu, etc.  For more information on healthy eating, go to  www.thenutritionsource.org  Drink alcohol in moderation- Limit alcohol intake two drinks or less a day.  Never drink and drive.  Dentist- Brush and floss teeth twice daily; visit your dentis twice a year.  Depression-Your emotional health is as important as your physical health.  If you're feeling down, losing interest in things you normally enjoy please talk with your healthcare provider.  Gun Safety- If you keep a gun in your home, keep it unloaded and with the safety lock on.  Bullets should be stored separately.  Helmet use- Always wear a helmet when riding a motorcycle, bicycle, rollerblading or skateboarding.  Safe sex- If you may be exposed to a sexually transmitted infection, use a condom  Seat belts- Seat bels can save your life; always wear one.  Smoke/Carbon Monoxide detectors- These detectors need to be installed on the appropriate level of your home.  Replace batteries at least once a year.  Skin Cancer- When out in the sun, cover up and use sunscreen SPF 15 or higher.  Violence- If anyone is threatening or hurting you, please tell your healthcare provider.  Hypertension Hypertension, commonly called high blood pressure, is when the force of blood pumping through the arteries is too strong. The arteries are the blood vessels that carry blood from the heart throughout the body. Hypertension forces the heart to work harder to pump blood and may cause arteries to become narrow or stiff. Having untreated or uncontrolled hypertension can cause heart attacks, strokes, kidney disease, and other problems. A blood pressure reading consists of a higher number over a lower number. Ideally, your blood pressure should be below 120/80. The first ("top") number is called the systolic pressure. It is a measure of the pressure in your arteries as your heart beats. The second  ("bottom") number is called the diastolic pressure. It is a measure of the pressure in your arteries as the heart relaxes. What are the causes? The cause of this condition is not known. What increases the risk? Some risk factors for high blood pressure are under your control. Others are not. Factors you can change  Smoking.  Having type 2 diabetes mellitus, high cholesterol, or both.  Not getting enough exercise or physical activity.  Being overweight.  Having too much fat, sugar, calories, or salt (sodium) in your diet.  Drinking too much alcohol. Factors that are difficult or impossible to change  Having chronic kidney disease.  Having a family history of high blood pressure.  Age. Risk increases with age.  Race. You may be at higher risk if you are African-American.  Gender. Men are at higher risk than women before age 40. After age 165, women are at higher risk than men.  Having obstructive sleep apnea.  Stress. What are the signs or symptoms? Extremely high blood pressure (hypertensive crisis) may cause:  Headache.  Anxiety.  Shortness of breath.  Nosebleed.  Nausea and vomiting.  Severe chest pain.  Jerky movements you cannot control (seizures).  How is this diagnosed? This condition is diagnosed by measuring your blood pressure while you are seated, with your arm resting on a surface. The cuff of the blood pressure monitor will be placed directly against the skin of your upper arm at the level of your heart. It should be measured at least twice using the same arm. Certain conditions can cause a difference in blood pressure between your right and left arms. Certain factors can cause blood pressure readings to be lower or higher than normal (elevated) for  a short period of time:  When your blood pressure is higher when you are in a health care provider's office than when you are at home, this is called white coat hypertension. Most people with this condition  do not need medicines.  When your blood pressure is higher at home than when you are in a health care provider's office, this is called masked hypertension. Most people with this condition may need medicines to control blood pressure.  If you have a high blood pressure reading during one visit or you have normal blood pressure with other risk factors:  You may be asked to return on a different day to have your blood pressure checked again.  You may be asked to monitor your blood pressure at home for 1 week or longer.  If you are diagnosed with hypertension, you may have other blood or imaging tests to help your health care provider understand your overall risk for other conditions. How is this treated? This condition is treated by making healthy lifestyle changes, such as eating healthy foods, exercising more, and reducing your alcohol intake. Your health care provider may prescribe medicine if lifestyle changes are not enough to get your blood pressure under control, and if:  Your systolic blood pressure is above 130.  Your diastolic blood pressure is above 80.  Your personal target blood pressure may vary depending on your medical conditions, your age, and other factors. Follow these instructions at home: Eating and drinking  Eat a diet that is high in fiber and potassium, and low in sodium, added sugar, and fat. An example eating plan is called the DASH (Dietary Approaches to Stop Hypertension) diet. To eat this way: ? Eat plenty of fresh fruits and vegetables. Try to fill half of your plate at each meal with fruits and vegetables. ? Eat whole grains, such as whole wheat pasta, brown rice, or whole grain bread. Fill about one quarter of your plate with whole grains. ? Eat or drink low-fat dairy products, such as skim milk or low-fat yogurt. ? Avoid fatty cuts of meat, processed or cured meats, and poultry with skin. Fill about one quarter of your plate with lean proteins, such as fish,  chicken without skin, beans, eggs, and tofu. ? Avoid premade and processed foods. These tend to be higher in sodium, added sugar, and fat.  Reduce your daily sodium intake. Most people with hypertension should eat less than 1,500 mg of sodium a day.  Limit alcohol intake to no more than 1 drink a day for nonpregnant women and 2 drinks a day for men. One drink equals 12 oz of beer, 5 oz of wine, or 1 oz of hard liquor. Lifestyle  Work with your health care provider to maintain a healthy body weight or to lose weight. Ask what an ideal weight is for you.  Get at least 30 minutes of exercise that causes your heart to beat faster (aerobic exercise) most days of the week. Activities may include walking, swimming, or biking.  Include exercise to strengthen your muscles (resistance exercise), such as pilates or lifting weights, as part of your weekly exercise routine. Try to do these types of exercises for 30 minutes at least 3 days a week.  Do not use any products that contain nicotine or tobacco, such as cigarettes and e-cigarettes. If you need help quitting, ask your health care provider.  Monitor your blood pressure at home as told by your health care provider.  Keep all follow-up visits  as told by your health care provider. This is important. Medicines  Take over-the-counter and prescription medicines only as told by your health care provider. Follow directions carefully. Blood pressure medicines must be taken as prescribed.  Do not skip doses of blood pressure medicine. Doing this puts you at risk for problems and can make the medicine less effective.  Ask your health care provider about side effects or reactions to medicines that you should watch for. Contact a health care provider if:  You think you are having a reaction to a medicine you are taking.  You have headaches that keep coming back (recurring).  You feel dizzy.  You have swelling in your ankles.  You have trouble with  your vision. Get help right away if:  You develop a severe headache or confusion.  You have unusual weakness or numbness.  You feel faint.  You have severe pain in your chest or abdomen.  You vomit repeatedly.  You have trouble breathing. Summary  Hypertension is when the force of blood pumping through your arteries is too strong. If this condition is not controlled, it may put you at risk for serious complications.  Your personal target blood pressure may vary depending on your medical conditions, your age, and other factors. For most people, a normal blood pressure is less than 120/80.  Hypertension is treated with lifestyle changes, medicines, or a combination of both. Lifestyle changes include weight loss, eating a healthy, low-sodium diet, exercising more, and limiting alcohol. This information is not intended to replace advice given to you by your health care provider. Make sure you discuss any questions you have with your health care provider. Document Released: 11/06/2005 Document Revised: 10/04/2016 Document Reviewed: 10/04/2016 Elsevier Interactive Patient Education  2018 [employer].    IF you received an x-ray today, you will receive an invoice from [facility]. Please contact [facility] at [phone] with questions or concerns regarding your invoice.   IF you received labwork today, you will receive an invoice from [facility]. Please contact [facility] at [phone] with questions or concerns regarding your invoice.   Our billing staff will not be able to assist you with questions regarding bills from these companies.  You will be contacted with the lab results as soon as they are available. The fastest way to get your results is to activate your My Chart account. Instructions are located on the last page of this paperwork. If you have not heard from [place] regarding the results in 2 weeks, please contact this office.       I personally performed  the services described in this documentation, which was scribed in my presence. The recorded information has been reviewed and considered for accuracy and completeness, addended by me as needed, and agree with information above.  Signed,   Meredith Staggers, MD Primary Care at [facility].  02/25/18 2:01 PM

## 2018-02-26 LAB — CBC
HEMATOCRIT: 43.3 % (ref 37.5–51.0)
HEMOGLOBIN: 13.9 g/dL (ref 13.0–17.7)
MCH: 26.6 pg (ref 26.6–33.0)
MCHC: 32.1 g/dL (ref 31.5–35.7)
MCV: 83 fL (ref 79–97)
Platelets: 302 10*3/uL (ref 150–379)
RBC: 5.22 x10E6/uL (ref 4.14–5.80)
RDW: 13.6 % (ref 12.3–15.4)
WBC: 7.2 10*3/uL (ref 3.4–10.8)

## 2018-02-26 LAB — COMPREHENSIVE METABOLIC PANEL
A/G RATIO: 2 (ref 1.2–2.2)
ALT: 23 IU/L (ref 0–44)
AST: 16 IU/L (ref 0–40)
Albumin: 4.6 g/dL (ref 3.5–5.5)
Alkaline Phosphatase: 35 IU/L — ABNORMAL LOW (ref 39–117)
BUN/Creatinine Ratio: 14 (ref 9–20)
BUN: 16 mg/dL (ref 6–24)
Bilirubin Total: 0.4 mg/dL (ref 0.0–1.2)
CO2: 22 mmol/L (ref 20–29)
Calcium: 9.7 mg/dL (ref 8.7–10.2)
Chloride: 103 mmol/L (ref 96–106)
Creatinine, Ser: 1.13 mg/dL (ref 0.76–1.27)
GFR calc Af Amer: 93 mL/min/{1.73_m2} (ref >59)
GFR calc non Af Amer: 81 mL/min/{1.73_m2} (ref >59)
GLOBULIN, TOTAL: 2.3 g/dL (ref 1.5–4.5)
Glucose: 85 mg/dL (ref 65–99)
POTASSIUM: 4.3 mmol/L (ref 3.5–5.2)
SODIUM: 140 mmol/L (ref 134–144)
Total Protein: 6.9 g/dL (ref 6.0–8.5)

## 2018-02-26 LAB — LIPID PANEL
Chol/HDL Ratio: 3.7 ratio (ref 0.0–5.0)
Cholesterol, Total: 229 mg/dL — ABNORMAL HIGH (ref 100–199)
HDL: 62 mg/dL (ref >39)
LDL Calculated: 154 mg/dL — ABNORMAL HIGH (ref 0–99)
TRIGLYCERIDES: 67 mg/dL (ref 0–149)
VLDL Cholesterol Cal: 13 mg/dL (ref 5–40)

## 2018-02-26 LAB — TSH: TSH: 1.15 u[IU]/mL (ref 0.450–4.500)

## 2018-04-09 ENCOUNTER — Other Ambulatory Visit: Payer: 59

## 2018-04-17 ENCOUNTER — Other Ambulatory Visit: Payer: Self-pay

## 2018-06-21 ENCOUNTER — Other Ambulatory Visit: Payer: Self-pay | Admitting: Nurse Practitioner

## 2018-06-21 ENCOUNTER — Ambulatory Visit
Admission: RE | Admit: 2018-06-21 | Discharge: 2018-06-21 | Disposition: A | Discharge status: Home / Self Care | Payer: No Typology Code available for payment source | Source: Ambulatory Visit | Attending: Nurse Practitioner | Admitting: Nurse Practitioner

## 2018-06-21 DIAGNOSIS — Z021 Encounter for pre-employment examination: Secondary | ICD-10-CM

## 2018-07-05 ENCOUNTER — Telehealth: Payer: Self-pay | Admitting: Family Medicine

## 2018-07-05 NOTE — Telephone Encounter (Signed)
Spoke with pt - he wanted to know if he has had a Hep B He has not had that immunization here.  He will get [facility] the health department.

## 2018-07-05 NOTE — Telephone Encounter (Signed)
Copied from CRM [id number]. Topic: General - Other >> Jul 03, 2018 10:01 AM Gaynelle AduPoole, Shalonda wrote: Reason for CRM: Patient is calling to see if he has received a  TB  shot for a new employment. Cb# [phone] . Please advise

## 2018-07-15 ENCOUNTER — Telehealth: Payer: Self-pay | Admitting: *Deleted

## 2018-07-15 NOTE — Telephone Encounter (Signed)
LMVM for pt to return call about appt tomorrow or Thursday with CM/NP.  I left times for those days.  Need to see prior to filling out p/w.

## 2018-07-16 ENCOUNTER — Encounter: Payer: Self-pay | Admitting: Nurse Practitioner

## 2018-07-16 ENCOUNTER — Ambulatory Visit: Payer: BLUE CROSS/BLUE SHIELD | Admitting: Nurse Practitioner

## 2018-07-16 VITALS — BP 150/86 | HR 82 | Ht 72.0 in | Wt 210.0 lb

## 2018-07-16 DIAGNOSIS — G40309 Generalized idiopathic epilepsy and epileptic syndromes, not intractable, without status epilepticus: Secondary | ICD-10-CM | POA: Diagnosis not present

## 2018-07-16 MED ORDER — LEVETIRACETAM 750 MG PO TABS: ORAL_TABLET | ORAL | 3 refills | Status: DC

## 2018-07-16 NOTE — Progress Notes (Signed)
[facility]  PATIENT: Jim Fisher DOB: 1976/07/02   REASON FOR VISIT: Follow-up for history of seizure disorder, generalized HISTORY FROM: Patient    HISTORY OF PRESENT ILLNESS:8/27/19CM Jim Fisher, 42 year old black male returns for yearly followup.  He has a history of seizures. This patient has done very well on Keppra, taking 750 mg twice daily. Patient is tolerating the medication very well. Patient has changed his diet, and this has improved his hypertension, he is currently on Lisinopril.  Patient is operating a motor vehicle. Patient works in Airline pilot at [employer]. Last seizure 9.5 yrs ago. Denies side effects to Keppra.  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  He has forms to be filled out for neurologic clearance.  His EEG in the past has been abnormal that was 1999.  We will repeat.  He returns for reevaluation he needs refills on his medication .  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    REVIEW OF SYSTEMS: Full 14 system review of systems performed and notable only for those listed, all others are neg:  Constitutional: neg  Cardiovascular: neg Ear/Nose/Throat: neg  Skin: neg Eyes: neg Respiratory: neg Gastroitestinal: neg  Hematology/Lymphatic: neg  Endocrine: neg Musculoskeletal:neg Allergy/Immunology: neg Neurological: History of seizure disorder Psychiatric: Periodic anxiety Sleep : neg   ALLERGIES: No Known Allergies  HOME MEDICATIONS: Outpatient Medications Prior to Visit  Medication Sig Dispense Refill  . levETIRAcetam (KEPPRA) 750 MG tablet TAKE 1 TABLET (750 MG TOTAL) BY MOUTH 2 (TWO) TIMES DAILY. 180 tablet 3  . lisinopril (PRINIVIL,ZESTRIL) 10 MG tablet Take 1 tablet (10 mg total) by mouth daily. 90 tablet 1   No facility-administered medications prior to visit.     PAST MEDICAL HISTORY: Past Medical History:  Diagnosis Date  . Anxiety     . Epilepsy (HCC)   . Hypertension   . Palpitations   . Seizures (HCC)     PAST SURGICAL HISTORY: Past Surgical History:  Procedure Laterality Date  . SHOULDER ARTHROSCOPY W/ ACROMIAL REPAIR  2008    FAMILY HISTORY: Family History  Problem Relation Age of Onset  . Hypertension Maternal Grandmother   . Diabetes Maternal Grandmother     SOCIAL HISTORY: Social History   Socioeconomic History  . Marital status: Married    Spouse name: Diane   . Number of children: 1  . Years of education: Not on file  . Highest education level: Not on file  Occupational History  . Occupation: Investment banker, corporate: [employer]  Social Needs  . Financial resource strain: Not on file  . Food insecurity:    Worry: Not on file    Inability: Not on file  . Transportation needs:    Medical: Not on file    Non-medical: Not on file  Tobacco Use  . Smoking status: Former Smoker    Years: 2.00    Types: Cigarettes    Last attempt to quit: 11/20/1996    Years since quitting: 21.6  . Smokeless tobacco: Never Used  . Tobacco comment: SMOKED IN HIGH SCHOOL AT LEAST 3 CIGARETTES PER DAY  Substance and Sexual Activity  . Alcohol use: No    Alcohol/week: 1.0 standard drinks    Types: 1 Cans of beer per week    Comment: every 2 weeks   . Drug use: No  . Sexual activity: Yes  Comment: number of sex partners in the last 12 months 1  Lifestyle  . Physical activity:    Days per week: Not on file    Minutes per session: Not on file  . Stress: Not on file  Relationships  . Social connections:    Talks on phone: Not on file    Gets together: Not on file    Attends religious service: Not on file    Active member of club or organization: Not on file    Attends meetings of clubs or organizations: Not on file    Relationship status: Not on file  . Intimate partner violence:    Fear of current or ex partner: Not on file    Emotionally abused: Not on file    Physically abused: Not on file    Forced  sexual activity: Not on file  Other Topics Concern  . Not on file  Social History Narrative   Exercise doing weights and cardio 5 x/week for 1 hour.    Patient works at [employer].   Patient has some college.   Patient is married with one child.      PHYSICAL EXAM  Vitals:   07/18/17 0928  BP: 130/80   Pulse: 85  Weight: 210 lb 8 oz (95.5 kg)  Height: 5\' 11"  (1.803 m)   There is no height or weight on file to calculate BMI. General: well developed, well nourished, seated, in no evident distress  Head: head normocephalic and atraumatic. Oropharynx benign  Neck: supple Cardiovascular: regular rate and rhythm,  Neurologic Exam  Mental Status: Awake and fully alert. Oriented to place and time. Follows all commands. Speech and language normal.  Cranial Nerves: Pupils equal, briskly reactive to light. Extraocular movements full without nystagmus. Visual fields full to confrontation. Hearing intact and symmetric to finger snap. Face, tongue, palate move normally and symmetrically. Neck flexion and extension normal.  Motor: Normal bulk and tone. Normal strength in all tested extremity muscles.No focal weakness  Coordination: Rapid alternating movements normal in all extremities. Finger-to-nose and heel-to-shin performed accurately bilaterally. No dysmetria  Gait and Station: Arises from chair without difficulty. Stance is normal. Gait demonstrates normal stride length and balance . Able to heel, toe and tandem walk without difficulty.  Reflexes: 2+ and symmetric. Toes downgoing.  DIAGNOSTIC DATA (LABS, IMAGING, TESTING) - I reviewed patient records, labs, notes, testing and imaging myself where available.  Lab Results  Component Value Date   WBC 7.2 02/25/2018   HGB 13.9 02/25/2018   HCT 43.3 02/25/2018   MCV 83 02/25/2018   PLT 302 02/25/2018     ASSESSMENT AND PLAN  42 y.o. year old male  has a past medical history of Anxiety, Epilepsy (HCC), Hypertension,  Palpitations, and Seizures (HCC). here to follow-up.    PLAN: Please continue Keppra at current dose will refill Routine labs by Dr. Chilton SiGreen PCP recent CBC and CMP WNL.  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 HospitalBC, APRN  [facility] [address], [address] [place], [place] [address] [phone]

## 2018-07-16 NOTE — Telephone Encounter (Signed)
Pt accepted appt today at 2:45  FYI

## 2018-07-16 NOTE — Progress Notes (Signed)
I have read the note, and I agree with the clinical assessment and plan.  Charles K Willis   

## 2018-07-16 NOTE — Patient Instructions (Signed)
Please continue Keppra at current dose will refill Routine labs by Dr. Chilton SiGreen PCP Repeat EEG  Pt has forms to be filled out for Fire [employer] clearance Call for any seizure activity F/U yearly and prn

## 2018-07-24 ENCOUNTER — Ambulatory Visit: Payer: BLUE CROSS/BLUE SHIELD | Admitting: Neurology

## 2018-07-24 DIAGNOSIS — G40309 Generalized idiopathic epilepsy and epileptic syndromes, not intractable, without status epilepticus: Secondary | ICD-10-CM

## 2018-07-24 NOTE — Procedures (Signed)
    History:  Jim Fisher is a 42 year old gentleman with a history of seizures, the last known seizure was over 9 years ago.  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  The patient is being reevaluated for his seizures prior to entering into this job.  This is a routine EEG study.  No skull defects noted.  Medications include Keppra and lisinopril.  EEG classification: Normal awake  Description of the recording: The background rhythms of this recording consists of a fairly well modulated medium amplitude alpha rhythm of 9 Hz that is reactive to eye opening and closure. As the record progresses, the patient appears to remain in the waking state throughout the recording. Photic stimulation was performed, resulting in a bilateral and symmetric photic driving response. Hyperventilation was also performed, resulting in a minimal buildup of the background rhythm activities without significant slowing seen. At no time during the recording does there appear to be evidence of spike or spike wave discharges or evidence of focal slowing. EKG monitor shows no evidence of cardiac rhythm abnormalities with exception of occasional PVCs, with a heart rate of 78.  Impression: This is a normal EEG recording in the waking state. No evidence of ictal or interictal discharges are seen.

## 2018-07-25 ENCOUNTER — Telehealth: Payer: Self-pay | Admitting: *Deleted

## 2018-07-25 NOTE — Telephone Encounter (Signed)
Spoke to pt and he will come in Wednesday at 0815.  He is aware of no charge.  It is a sleep deprived EEG, he will sleep while he is here for the EEG.  I gave the normal EEG results while awake.  He verbalized understanding.

## 2018-07-25 NOTE — Telephone Encounter (Signed)
Spoke to pt and relayed to call me back concerning  Awake EEG that was done and the need to do sleep EEG.  Monday 9/9 0700 by Weston Brass or 9/11 0815 by Weston Brass.  Spoke to Aliene Beams, NP concerning her recommendation on pt have both awake and sleep EEG and she stated to have both done.  I relayed to pt in VM that this will be done at no charge.

## 2018-07-29 NOTE — Telephone Encounter (Signed)
I spoke to pt and relayed that his awake EEG was normal.  He is to come in on 07-31-18 at 0815 after being sleep deprived. This will be a sleep EEG (pt aware he is to sleep when he has this one done on 07/31/18.

## 2018-07-29 NOTE — Telephone Encounter (Signed)
-----   Message from Nilda Riggs, NP sent at 07/25/2018  4:26 PM EDT ----- EEG was normal Needs sleep deprived

## 2018-07-31 ENCOUNTER — Ambulatory Visit (INDEPENDENT_AMBULATORY_CARE_PROVIDER_SITE_OTHER): Payer: Self-pay | Admitting: Neurology

## 2018-07-31 DIAGNOSIS — G40909 Epilepsy, unspecified, not intractable, without status epilepticus: Secondary | ICD-10-CM

## 2018-07-31 NOTE — Procedures (Signed)
    History:  Jim Fisher is a 42 year old gentleman with a history of seizures, the last such event was over 9 years ago.  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  This was set up as a sleep deprived EEG recording, no skull defects are noted.  Medications include Keppra and lisinopril.  EEG classification: Normal awake  Description of the recording: The background rhythms of this recording consists of a fairly well modulated medium amplitude alpha rhythm of 8 Hz that is reactive to eye opening and closure. As the record progresses, the patient appears to remain in the waking state throughout the recording. Photic stimulation was performed, resulting in a bilateral and symmetric photic driving response. Hyperventilation was also performed, resulting in a minimal buildup of the background rhythm activities without significant slowing seen. At no time during the recording does there appear to be evidence of spike or spike wave discharges or evidence of focal slowing. EKG monitor shows no evidence of cardiac rhythm abnormalities with a heart rate of 72.  Impression: This is a normal EEG recording in the waking state. No evidence of ictal or interictal discharges are seen.  This was a sleep deprived EEG recording, but the patient never entered into the drowsy state or stage II sleep.

## 2018-08-05 ENCOUNTER — Telehealth: Payer: Self-pay | Admitting: *Deleted

## 2018-08-05 NOTE — Telephone Encounter (Signed)
There is not a next step I will send the information to the medical director Dr. Alto DenverHunt ultimately it is her call at this point.

## 2018-08-05 NOTE — Telephone Encounter (Signed)
Spoke to pt and let him know that the EEG was normal awake, I relayed CM/NP message to pt and he will f/u with them Wynona DoveKaren Wilkerson,NP.  He is to call back as needed.

## 2018-08-05 NOTE — Telephone Encounter (Signed)
Pt calling asking what the next step is for him.  He was not able to fall asleep for his second sleep deprived EEG.  This last one was a no charge (due to the last one he was not aware that he needed to be asleep).  Please advise.  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

## 2018-08-06 NOTE — Telephone Encounter (Signed)
Fax confirmation received [phone] [facility]. (neuro- clearance for fire academy).  To MR for scanning.

## 2018-08-15 ENCOUNTER — Other Ambulatory Visit: Payer: Self-pay | Admitting: Family Medicine

## 2018-08-15 DIAGNOSIS — I10 Essential (primary) hypertension: Secondary | ICD-10-CM

## 2018-08-16 ENCOUNTER — Ambulatory Visit: Payer: BLUE CROSS/BLUE SHIELD | Admitting: Emergency Medicine

## 2018-08-16 ENCOUNTER — Encounter: Payer: Self-pay | Admitting: Emergency Medicine

## 2018-08-16 VITALS — BP 158/74 | HR 79 | Temp 98.6°F | Resp 17 | Ht 72.0 in | Wt 213.0 lb

## 2018-08-16 DIAGNOSIS — K146 Glossodynia: Secondary | ICD-10-CM

## 2018-08-16 MED ORDER — AMOXICILLIN-POT CLAVULANATE 875-125 MG PO TABS: 1.0000 | ORAL_TABLET | Freq: Two times a day (BID) | ORAL | 0 refills | Status: AC

## 2018-08-16 NOTE — Progress Notes (Signed)
Jim Fisher 42 y.o.   Chief Complaint  Patient presents with  . pain under tongue    HISTORY OF PRESENT ILLNESS: This is a 42 y.o. male complaining of pain on the wrist on since this morning.  Denies injury.  No other significant symptoms  HPI   Prior to Admission medications   Medication Sig Start Date End Date Taking? Authorizing Provider  levETIRAcetam (KEPPRA) 750 MG tablet TAKE 1 TABLET (750 MG TOTAL) BY MOUTH 2 (TWO) TIMES DAILY. 07/16/18  Yes Nilda Riggs, NP  lisinopril (PRINIVIL,ZESTRIL) 10 MG tablet TAKE 1 TABLET BY MOUTH EVERY DAY 08/15/18  Yes Shade Flood, MD    No Known Allergies  Patient Active Problem List   Diagnosis Date Noted  . Cough 05/02/2017  . Acute bronchitis 05/02/2017  . PVC (premature ventricular contraction) 09/22/2016  . 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 Anxiety   . Epilepsy (HCC)   . Hypertension   . Palpitations   . Seizures (HCC)     Past Surgical History:  Procedure Laterality Date  . SHOULDER ARTHROSCOPY W/ ACROMIAL REPAIR  2008    Social History   Socioeconomic History  . Marital status: Married    Spouse name: Diane   . Number of children: 1  . Years of education: Not on file  . Highest education level: Not on file  Occupational History  . Occupation: Investment banker, corporate: [employer]  Social Needs  . Financial resource strain: Not on file  . Food insecurity:    Worry: Not on file    Inability: Not on file  . Transportation needs:    Medical: Not on file    Non-medical: Not on file  Tobacco Use  . Smoking status: Former Smoker    Years: 2.00    Types: Cigarettes    Last attempt to quit: 11/20/1996    Years since quitting: 21.7  . Smokeless tobacco: Never Used  . Tobacco comment: SMOKED IN HIGH SCHOOL AT LEAST 3 CIGARETTES PER DAY  Substance and Sexual Activity  . Alcohol use: No    Alcohol/week: 1.0 standard drinks    Types: 1 Cans of beer  per week    Comment: every 2 weeks   . Drug use: No  . Sexual activity: Yes    Comment: number of sex partners in the last 12 months 1  Lifestyle  . Physical activity:    Days per week: Not on file    Minutes per session: Not on file  . Stress: Not on file  Relationships  . Social connections:    Talks on phone: Not on file    Gets together: Not on file    Attends religious service: Not on file    Active member of club or organization: Not on file    Attends meetings of clubs or organizations: Not on file    Relationship status: Not on file  . Intimate partner violence:    Fear of current or ex partner: Not on file    Emotionally abused: Not on file    Physically abused: Not on file    Forced sexual activity: Not on file  Other Topics Concern  . Not on file  Social History Narrative   Exercise doing weights and cardio 5 x/week for 1 hour.    Patient works at [employer].   Patient has some college.   Patient is married with one  child.     Family History  Problem Relation Age of Onset  . Hypertension Maternal Grandmother   . Diabetes Maternal Grandmother      Review of Systems  Constitutional: Negative.  Negative for chills and fever.  HENT: Negative for ear pain and sore throat.        Tongue pain  Respiratory: Negative for shortness of breath.   Cardiovascular: Negative.   Gastrointestinal: Negative for nausea and vomiting.  Skin: Negative.  Negative for rash.  Neurological: Negative for dizziness and headaches.  Endo/Heme/Allergies: Negative.   All other systems reviewed and are negative.  Vitals:   08/16/18 1616  BP: (!) 158/74  Pulse: 79  Resp: 17  Temp: 98.6 F (37 C)  SpO2: 98%     Physical Exam  Constitutional: He is oriented to person, place, and time. He appears well-developed and well-nourished.  HENT:  Head: Normocephalic and atraumatic.  Mouth/Throat: Oropharynx is clear and moist.  Redness and mild swelling to frenulum of tongue  Eyes:  Pupils are equal, round, and reactive to light. EOM are normal.  Neck: Normal range of motion. Neck supple.  Cardiovascular: Normal rate.  Pulmonary/Chest: Effort normal.  Musculoskeletal: Normal range of motion.  Neurological: He is alert and oriented to person, place, and time.  Skin: Skin is warm and dry. Capillary refill takes less than 2 seconds.  Psychiatric: He has a normal mood and affect. His behavior is normal.  Vitals reviewed.    ASSESSMENT & PLAN: Kamare was seen today for pain under tongue.  Diagnoses and all orders for this visit:  Tongue pain Comments: Suspected salivary stone with residual inflammation Orders: -     amoxicillin-clavulanate (AUGMENTIN) 875-125 MG tablet; Take 1 tablet by mouth 2 (two) times daily for 7 days.      Edwina Barth, MD [facility]

## 2018-08-16 NOTE — Patient Instructions (Signed)
° ° ° °  If you have lab work done today you will be contacted with your lab results within the next 2 weeks.  If you have not heard from us then please contact us. The fastest way to get your results is to register for My Chart. ° ° °IF you received an x-ray today, you will receive an invoice from [facility]. Please contact [facility] at [phone] with questions or concerns regarding your invoice.  ° °IF you received labwork today, you will receive an invoice from [facility]. Please contact [facility] at [phone] with questions or concerns regarding your invoice.  ° °Our billing staff will not be able to assist you with questions regarding bills from these companies. ° °You will be contacted with the lab results as soon as they are available. The fastest way to get your results is to activate your My Chart account. Instructions are located on the last page of this paperwork. If you have not heard from us regarding the results in 2 weeks, please contact this office. °  ° ° ° °

## 2018-10-21 DIAGNOSIS — R310 Gross hematuria: Secondary | ICD-10-CM | POA: Diagnosis not present

## 2018-10-26 ENCOUNTER — Other Ambulatory Visit: Payer: Self-pay | Admitting: Family Medicine

## 2018-10-26 DIAGNOSIS — I10 Essential (primary) hypertension: Secondary | ICD-10-CM

## 2018-10-28 DIAGNOSIS — N35812 Other urethral bulbous stricture, male: Secondary | ICD-10-CM | POA: Diagnosis not present

## 2018-10-28 DIAGNOSIS — R310 Gross hematuria: Secondary | ICD-10-CM | POA: Diagnosis not present

## 2018-10-28 NOTE — Telephone Encounter (Signed)
Patient called, left VM to return call to the office. Noted for patient to return to the office around 08/27/18 for follow up, will need to schedule patient for follow up.

## 2018-10-28 NOTE — Telephone Encounter (Signed)
Requested medication (s) are due for refill today: Yes  Requested medication (s) are on the active medication list: Yes  Last refill:  08/15/18  Future visit scheduled: No  Notes to clinic:  Unable to refill per protocol due to failed items, appointment needed.     Requested Prescriptions  Pending Prescriptions Disp Refills   lisinopril (PRINIVIL,ZESTRIL) 10 MG tablet [Pharmacy Med Name: LISINOPRIL 10 MG TABLET] 90 tablet 1    Sig: TAKE 1 TABLET BY MOUTH EVERY DAY     Cardiovascular:  ACE Inhibitors Failed - 10/28/2018  1:05 PM      Failed - Cr in normal range and within 180 days    Creat  Date Value Ref Range Status  03/19/2014 1.18 0.50 - 1.35 mg/dL Final   Creatinine, Ser  Date Value Ref Range Status  02/25/2018 1.13 0.76 - 1.27 mg/dL Final         Failed - K in normal range and within 180 days    Potassium  Date Value Ref Range Status  02/25/2018 4.3 3.5 - 5.2 mmol/L Final         Failed - Last BP in normal range    BP Readings from Last 1 Encounters:  08/16/18 (!) 158/74         Failed - Valid encounter within last 6 months    Recent Outpatient Visits          2 months ago Tongue pain   [facility], Eilleen KempfMiguel Jose, MD   8 months ago Annual physical exam   [facility], Asencion PartridgeJeffrey R, MD   1 year ago Acute bronchitis, unspecified organism   [facility] Eilleen KempfMiguel Jose, MD   2 years ago PVC (premature ventricular contraction)   [facility], Dorene GrebeNatalie, DO   4 years ago Sore throat   [facility], Sandria Balesavid H, MD             Passed - Patient is not pregnant

## 2018-10-29 ENCOUNTER — Other Ambulatory Visit: Payer: Self-pay | Admitting: Emergency Medicine

## 2018-10-29 DIAGNOSIS — I10 Essential (primary) hypertension: Secondary | ICD-10-CM

## 2018-12-19 ENCOUNTER — Other Ambulatory Visit: Payer: Self-pay | Admitting: Family Medicine

## 2018-12-19 DIAGNOSIS — I10 Essential (primary) hypertension: Secondary | ICD-10-CM

## 2018-12-19 NOTE — Telephone Encounter (Signed)
Courtesy refill until appointment on 12/24/18.

## 2018-12-24 ENCOUNTER — Encounter: Payer: Self-pay | Admitting: Family Medicine

## 2018-12-24 ENCOUNTER — Ambulatory Visit (INDEPENDENT_AMBULATORY_CARE_PROVIDER_SITE_OTHER): Payer: BLUE CROSS/BLUE SHIELD | Admitting: Family Medicine

## 2018-12-24 VITALS — BP 138/82 | HR 78 | Temp 98.3°F | Resp 17 | Ht 72.0 in | Wt 212.0 lb

## 2018-12-24 DIAGNOSIS — I493 Ventricular premature depolarization: Secondary | ICD-10-CM

## 2018-12-24 DIAGNOSIS — Z13220 Encounter for screening for lipoid disorders: Secondary | ICD-10-CM

## 2018-12-24 DIAGNOSIS — I10 Essential (primary) hypertension: Secondary | ICD-10-CM | POA: Diagnosis not present

## 2018-12-24 DIAGNOSIS — R002 Palpitations: Secondary | ICD-10-CM | POA: Diagnosis not present

## 2018-12-24 MED ORDER — LISINOPRIL 10 MG PO TABS: 10.0000 mg | ORAL_TABLET | Freq: Every day | ORAL | 2 refills | Status: DC

## 2018-12-24 NOTE — Progress Notes (Signed)
Subjective:    Patient ID: Jim Fisher, male    DOB: [birth year], 43 y.o.   MRN: [id number]  HPI Jim Fisher is a 43 y.o. male Presents today for: Chief Complaint  Patient presents with  . Hypertension    need refill on lisinopril    Hypertension: BP Readings from Last 3 Encounters:  12/24/18 138/82  08/16/18 (!) 158/74  07/16/18 (!) 150/86   Lab Results  Component Value Date   CREATININE 1.13 02/25/2018   home readings: as low as 120/70   On lisinopril 10mg  QD. No new side effects.    Heart palpitations: Hx of PVC's in past when eval by cardiology. Resolved for about 1 year, started to apply for job at [employer]. Hired, but possible increased stress. Notes harder beat, short pause, then normal. Feels symptoms all day long. Similar to in the past. No chest pain. No lightheadedness/dizziness.  No IDU.  Saw Dr. Elberta Fortisamnitz in July 2018 - less PVC burden at that time.  No recent dose changes of Keppra. Seizure free for 10 years.   Cut back on coffee, alcohol. No changes.    Patient Active Problem List   Diagnosis Date Noted  . Tongue pain 08/16/2018  . Cough 05/02/2017  . Acute bronchitis 05/02/2017  . PVC (premature ventricular contraction) 09/22/2016  . 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 Anxiety   . Epilepsy (HCC)   . Hypertension   . Palpitations   . Seizures (HCC)    Past Surgical History:  Procedure Laterality Date  . SHOULDER ARTHROSCOPY W/ ACROMIAL REPAIR  2008   No Known Allergies Prior to Admission medications   Medication Sig Start Date End Date Taking? Authorizing Provider  levETIRAcetam (KEPPRA) 750 MG tablet TAKE 1 TABLET (750 MG TOTAL) BY MOUTH 2 (TWO) TIMES DAILY. 07/16/18  Yes Nilda RiggsMartin, Nancy Carolyn, NP  lisinopril (PRINIVIL,ZESTRIL) 10 MG tablet TAKE 1 TABLET BY MOUTH EVERY DAY 08/15/18  Yes Shade FloodGreene, Dixon Luczak R, MD  lisinopril (PRINIVIL,ZESTRIL) 10 MG tablet Take 1 tablet (10 mg total)  by mouth daily. Must Keep 12/24/18 appointment for additional refills 12/19/18  Yes Shade FloodGreene, Cheryll Keisler R, MD   Social History   Socioeconomic History  . Marital status: Married    Spouse name: Diane   . Number of children: 1  . Years of education: Not on file  . Highest education level: Not on file  Occupational History  . Occupation: Investment banker, [employer]    Employer: [employer]  Social Needs  . Financial resource strain: Not on file  . Food insecurity:    Worry: Not on file    Inability: Not on file  . Transportation needs:    Medical: Not on file    Non-medical: Not on file  Tobacco Use  . Smoking status: Former Smoker    Years: 2.00    Types: Cigarettes    Last attempt to quit: 11/20/1996    Years since quitting: 22.1  . Smokeless tobacco: Never Used  . Tobacco comment: SMOKED IN HIGH SCHOOL AT LEAST 3 CIGARETTES PER DAY  Substance and Sexual Activity  . Alcohol use: No    Alcohol/week: 1.0 standard drinks    Types: 1 Cans of beer per week    Comment: every 2 weeks   . Drug use: No  . Sexual activity: Yes    Comment: number of sex partners in the last 12 months 1  Lifestyle  . Physical activity:  Days per week: Not on file    Minutes per session: Not on file  . Stress: Not on file  Relationships  . Social connections:    Talks on phone: Not on file    Gets together: Not on file    Attends religious service: Not on file    Active member of club or organization: Not on file    Attends meetings of clubs or organizations: Not on file    Relationship status: Not on file  . Intimate partner violence:    Fear of current or ex partner: Not on file    Emotionally abused: Not on file    Physically abused: Not on file    Forced sexual activity: Not on file  Other Topics Concern  . Not on file  Social History Narrative   Exercise doing weights and cardio 5 x/week for 1 hour.    Patient works at [employer].   Patient has some college.   Patient is married with one child.     Review of  Systems  Constitutional: Negative for fatigue and unexpected weight change.  Eyes: Negative for visual disturbance.  Respiratory: Negative for cough, chest tightness and shortness of breath.   Cardiovascular: Positive for palpitations. Negative for chest pain and leg swelling.  Gastrointestinal: Negative for abdominal pain and blood in stool.  Neurological: Negative for dizziness, light-headedness and headaches.      Objective:   Physical Exam Vitals signs reviewed.  Constitutional:      Appearance: He is well-developed.  HENT:     Head: Normocephalic and atraumatic.  Eyes:     Pupils: Pupils are equal, round, and reactive to light.  Neck:     Vascular: No carotid bruit or JVD.  Cardiovascular:     Rate and Rhythm: Normal rate and regular rhythm.     Heart sounds: No murmur.     Comments: Single ectopic beat, otherwise nl.  rate/rhythm.  Pulmonary:     Effort: Pulmonary effort is normal.     Breath sounds: Normal breath sounds. No rales.  Skin:    General: Skin is warm and dry.  Neurological:     Mental Status: He is alert and oriented to person, place, and time.     Vitals:   12/24/18 1403 12/24/18 1418  BP: (!) 154/96 138/82  Pulse: 78   Resp: 17   Temp: 98.3 F (36.8 C)   TempSrc: Oral   SpO2: 98%   Weight: 212 lb (96.2 kg)   Height: 6' (1.829 m)    EKG reviewed, few PVCs, no apparent acute findings. Results for orders placed or performed in visit on 12/24/18  Comprehensive metabolic panel  Result Value Ref Range   Glucose 87 65 - 99 mg/dL   BUN 13 6 - 24 mg/dL   Creatinine, Ser 2.68 0.76 - 1.27 mg/dL   GFR calc non Af Amer 93 >59 mL/min/1.73   GFR calc Af Amer 107 >59 mL/min/1.73   BUN/Creatinine Ratio 13 9 - 20   Sodium 139 134 - 144 mmol/L   Potassium 3.8 3.5 - 5.2 mmol/L   Chloride 102 96 - 106 mmol/L   CO2 22 20 - 29 mmol/L   Calcium 9.3 8.7 - 10.2 mg/dL   Total Protein 6.8 6.0 - 8.5 g/dL   Albumin 4.5 4.0 - 5.0 g/dL   Globulin, Total 2.3 1.5 -  4.5 g/dL   Albumin/Globulin Ratio 2.0 1.2 - 2.2   Bilirubin Total 0.4 0.0 - 1.2  mg/dL   Alkaline Phosphatase 33 (L) 39 - 117 IU/L   AST 18 0 - 40 IU/L   ALT 23 0 - 44 IU/L  Lipid Panel  Result Value Ref Range   Cholesterol, Total 219 (H) 100 - 199 mg/dL   Triglycerides 61 0 - 149 mg/dL   HDL 53 >25>39 mg/dL   VLDL Cholesterol Cal 12 5 - 40 mg/dL   LDL Calculated 366154 (H) 0 - 99 mg/dL   Chol/HDL Ratio 4.1 0.0 - 5.0 ratio  Magnesium  Result Value Ref Range   Magnesium 1.8 1.6 - 2.3 mg/dL  TSH  Result Value Ref Range   TSH 0.909 0.450 - 4.500 uIU/mL  CBC  Result Value Ref Range   WBC 8.2 3.4 - 10.8 x10E3/uL   RBC 5.28 4.14 - 5.80 x10E6/uL   Hemoglobin 14.0 13.0 - 17.7 g/dL   Hematocrit 44.042.1 34.737.5 - 51.0 %   MCV 80 79 - 97 fL   MCH 26.5 (L) 26.6 - 33.0 pg   MCHC 33.3 31.5 - 35.7 g/dL   RDW 42.512.9 95.611.6 - 38.715.4 %   Platelets 288 150 - 450 x10E3/uL       Assessment & Plan:    Jim MacadamiaDarryl M Kottke is a 43 y.o. male Essential hypertension - Plan: lisinopril (PRINIVIL,ZESTRIL) 10 MG tablet, Comprehensive metabolic panel  -Borderline but stable on recheck.  Check labs, continue same dose lisinopril.  Screening for hyperlipidemia - Plan: Lipid Panel  Palpitations - Plan: Magnesium, TSH, CBC, EKG 12-Lead PVC's (premature ventricular contractions) - Plan: Magnesium, TSH, CBC, EKG 12-Lead  -PVCs noted, similar symptoms previously.  TSH, magnesium, CBC were stable as above.  Recommended follow-up with his cardiologist.  RTC/ER precautions if acute worsening of symptoms  Meds ordered this encounter  Medications  . lisinopril (PRINIVIL,ZESTRIL) 10 MG tablet    Sig: Take 1 tablet (10 mg total) by mouth daily. Must Keep 12/24/18 appointment for additional refills    Dispense:  90 tablet    Refill:  2   Patient Instructions   Call Dr. Elberta Fortisamnitz for an appointment to discuss the PVCs.  See information below.  I will check some electrolytes, thyroid testing, typical tests for your blood pressure  medication.  No changes in medicines at this time.  Thank you for coming in today.   Return to the clinic or go to the nearest emergency room if any of your symptoms worsen or new symptoms occur.   Premature Ventricular Contraction  A premature ventricular contraction (PVC) is a common kind of irregular heartbeat (arrhythmia). These contractions are extra heartbeats that start in the ventricles of the heart and occur too early in the normal sequence. During the PVC, the heart's normal electrical pathway is not used, so the beat is shorter and less effective. In most cases, these contractions come and go and do not require treatment. What are the causes? Common causes of the condition include:  Smoking.  Drinking alcohol.  Certain medicines.  Some illegal drugs.  Stress.  Caffeine. Certain medical conditions can also cause PVCs:  Heart failure.  Heart attack, or coronary artery disease.  Heart valve problems.  Changes in minerals in the blood (electrolytes).  Low blood oxygen levels or high carbon dioxide levels. In many cases, the cause of this condition is not known. What are the signs or symptoms? The main symptom of this condition is fast or skipped heartbeats (palpitations). Other symptoms include:  Chest pain.  Shortness of breath.  Feeling tired.  Dizziness.  Difficulty exercising. In some cases, there are no symptoms. How is this diagnosed? This condition may be diagnosed based on:  Your medical history.  A physical exam. During the exam, the health care provider will check for irregular heartbeats.  Tests, such as: ? An ECG (electrocardiogram) to monitor the electrical activity of your heart. ? Ambulatory cardiac monitor. This device records your heartbeats for 24 hours or more. ? Stress tests to see how exercise affects your heart rhythm and blood supply. ? Echocardiogram. This test uses sound waves (ultrasound) to produce an image of your  heart. ? Electrophysiology study (EPS). This test checks for electrical problems in your heart. How is this treated? Treatment for this condition depends on any underlying conditions, the type of PVCs that you are having, and how much the symptoms are interfering with your daily life. Possible treatments include:  Avoiding things that cause premature contractions (triggers). These include caffeine and alcohol.  Taking medicines if symptoms are severe or if the extra heartbeats are frequent.  Getting treatment for underlying conditions that cause PVCs.  Having an implantable cardioverter defibrillator (ICD), if you are at risk for a serious arrhythmia. The ICD is a small device that is inserted into your chest to monitor your heartbeat. When it senses an irregular heartbeat, it sends a shock to bring the heartbeat back to normal.  Having a procedure to destroy the portion of the heart tissue that sends out abnormal signals (catheter ablation). In some cases, no treatment is required. Follow these instructions at home: Alcohol use   Do not drink alcohol if: ? Your health care provider tells you not to drink. ? You are pregnant, may be pregnant, or are planning to become pregnant. ? Alcohol triggers your episodes.  If you drink alcohol, limit how much you have. You may drink: ? 0-1 drink a day for women. ? 0-2 drinks a day for men.  Be aware of how much alcohol is in your drink. In the [place] one drink equals one typical bottle of beer (12 oz), one-half glass of wine (5 oz), or one shot of hard liquor (1 oz). General instructions  Do not use any products that contain nicotine or tobacco, such as cigarettes and e-cigarettes. If you need help quitting, ask your health care provider.  Find healthy ways to manage stress. Avoid stressful situations when possible.  Exercise regularly. Ask your health care provider what type of exercise is safe for you.  Try to get at least 7-9 hours of  sleep each night, or as much as recommended by your health care provider.  If caffeine triggers episodes of PVC, do not eat, drink, or use anything with caffeine in it.  Do not use illegal drugs.  Take over-the-counter and prescription medicines only as told by your health care provider.  Keep all follow-up visits as told by your health care provider. This is important. Contact a health care provider if you:  Feel palpitations. Get help right away if you:  Have chest pain.  Have shortness of breath.  Have sweating for no reason.  Have nausea and vomiting.  Become light-headed or you faint. Summary  A premature ventricular contraction (PVC) is a common kind of irregular heartbeat (arrhythmia).  In most cases, these contractions come and go and do not require treatment.  You may need to wear an ambulatory cardiac monitor. This records your heartbeats for 24 hours or more.  Treatment depends on any underlying conditions, the type of  PVCs that you are having, and how much the symptoms are interfering with your daily life. This information is not intended to replace advice given to you by your health care provider. Make sure you discuss any questions you have with your health care provider. Document Released: 06/23/2004 Document Revised: 06/21/2018 Document Reviewed: 12/25/2017 Elsevier Interactive Patient Education  [employer].   If you have lab work done today you will be contacted with your lab results within the next 2 weeks.  If you have not heard from [place] then please contact us. The fastest way to get your results is to register for My Chart.   IF you received an x-ray today, you will receive an invoice from [facility]. Please contact [facility] at [phone] with questions or concerns regarding your invoice.   IF you received labwork today, you will receive an invoice from [facility]. Please contact [facility] at [phone] with questions or  concerns regarding your invoice.   Our billing staff will not be able to assist you with questions regarding bills from these companies.  You will be contacted with the lab results as soon as they are available. The fastest way to get your results is to activate your My Chart account. Instructions are located on the last page of this paperwork. If you have not heard from [place] regarding the results in 2 weeks, please contact this office.       Signed,   Meredith Staggers, MD [facility] at [facility].  12/28/18 7:13 PM

## 2018-12-24 NOTE — Patient Instructions (Addendum)
Call Dr. Elberta Fortisamnitz for an appointment to discuss the PVCs.  See information below.  I will check some electrolytes, thyroid testing, typical tests for your blood pressure medication.  No changes in medicines at this time.  Thank you for coming in today.   Return to the clinic or go to the nearest emergency room if any of your symptoms worsen or new symptoms occur.   Premature Ventricular Contraction  A premature ventricular contraction (PVC) is a common kind of irregular heartbeat (arrhythmia). These contractions are extra heartbeats that start in the ventricles of the heart and occur too early in the normal sequence. During the PVC, the heart's normal electrical pathway is not used, so the beat is shorter and less effective. In most cases, these contractions come and go and do not require treatment. What are the causes? Common causes of the condition include:  Smoking.  Drinking alcohol.  Certain medicines.  Some illegal drugs.  Stress.  Caffeine. Certain medical conditions can also cause PVCs:  Heart failure.  Heart attack, or coronary artery disease.  Heart valve problems.  Changes in minerals in the blood (electrolytes).  Low blood oxygen levels or high carbon dioxide levels. In many cases, the cause of this condition is not known. What are the signs or symptoms? The main symptom of this condition is fast or skipped heartbeats (palpitations). Other symptoms include:  Chest pain.  Shortness of breath.  Feeling tired.  Dizziness.  Difficulty exercising. In some cases, there are no symptoms. How is this diagnosed? This condition may be diagnosed based on:  Your medical history.  A physical exam. During the exam, the health care provider will check for irregular heartbeats.  Tests, such as: ? An ECG (electrocardiogram) to monitor the electrical activity of your heart. ? Ambulatory cardiac monitor. This device records your heartbeats for 24 hours or  more. ? Stress tests to see how exercise affects your heart rhythm and blood supply. ? Echocardiogram. This test uses sound waves (ultrasound) to produce an image of your heart. ? Electrophysiology study (EPS). This test checks for electrical problems in your heart. How is this treated? Treatment for this condition depends on any underlying conditions, the type of PVCs that you are having, and how much the symptoms are interfering with your daily life. Possible treatments include:  Avoiding things that cause premature contractions (triggers). These include caffeine and alcohol.  Taking medicines if symptoms are severe or if the extra heartbeats are frequent.  Getting treatment for underlying conditions that cause PVCs.  Having an implantable cardioverter defibrillator (ICD), if you are at risk for a serious arrhythmia. The ICD is a small device that is inserted into your chest to monitor your heartbeat. When it senses an irregular heartbeat, it sends a shock to bring the heartbeat back to normal.  Having a procedure to destroy the portion of the heart tissue that sends out abnormal signals (catheter ablation). In some cases, no treatment is required. Follow these instructions at home: Alcohol use   Do not drink alcohol if: ? Your health care provider tells you not to drink. ? You are pregnant, may be pregnant, or are planning to become pregnant. ? Alcohol triggers your episodes.  If you drink alcohol, limit how much you have. You may drink: ? 0-1 drink a day for women. ? 0-2 drinks a day for men.  Be aware of how much alcohol is in your drink. In the [place] one drink equals one typical bottle of beer (12  oz), one-half glass of wine (5 oz), or one shot of hard liquor (1 oz). General instructions  Do not use any products that contain nicotine or tobacco, such as cigarettes and e-cigarettes. If you need help quitting, ask your health care provider.  Find healthy ways to manage  stress. Avoid stressful situations when possible.  Exercise regularly. Ask your health care provider what type of exercise is safe for you.  Try to get at least 7-9 hours of sleep each night, or as much as recommended by your health care provider.  If caffeine triggers episodes of PVC, do not eat, drink, or use anything with caffeine in it.  Do not use illegal drugs.  Take over-the-counter and prescription medicines only as told by your health care provider.  Keep all follow-up visits as told by your health care provider. This is important. Contact a health care provider if you:  Feel palpitations. Get help right away if you:  Have chest pain.  Have shortness of breath.  Have sweating for no reason.  Have nausea and vomiting.  Become light-headed or you faint. Summary  A premature ventricular contraction (PVC) is a common kind of irregular heartbeat (arrhythmia).  In most cases, these contractions come and go and do not require treatment.  You may need to wear an ambulatory cardiac monitor. This records your heartbeats for 24 hours or more.  Treatment depends on any underlying conditions, the type of PVCs that you are having, and how much the symptoms are interfering with your daily life. This information is not intended to replace advice given to you by your health care provider. Make sure you discuss any questions you have with your health care provider. Document Released: 06/23/2004 Document Revised: 06/21/2018 Document Reviewed: 12/25/2017 Elsevier Interactive Patient Education  [employer]2019 [employer].   If you have lab work done today you will be contacted with your lab results within the next 2 weeks.  If you have not heard from us then please contact us. The fastest way to get your results is to register for My Chart.   IF you received an x-ray today, you will receive an invoice from [facility]. Please contact [facility] at [phone] with  questions or concerns regarding your invoice.   IF you received labwork today, you will receive an invoice from [facility]. Please contact [facility] at [phone] with questions or concerns regarding your invoice.   Our billing staff will not be able to assist you with questions regarding bills from these companies.  You will be contacted with the lab results as soon as they are available. The fastest way to get your results is to activate your My Chart account. Instructions are located on the last page of this paperwork. If you have not heard from us regarding the results in 2 weeks, please contact this office.

## 2018-12-25 LAB — COMPREHENSIVE METABOLIC PANEL
ALT: 23 IU/L (ref 0–44)
AST: 18 IU/L (ref 0–40)
Albumin/Globulin Ratio: 2 (ref 1.2–2.2)
Albumin: 4.5 g/dL (ref 4.0–5.0)
Alkaline Phosphatase: 33 IU/L — ABNORMAL LOW (ref 39–117)
BUN/Creatinine Ratio: 13 (ref 9–20)
BUN: 13 mg/dL (ref 6–24)
Bilirubin Total: 0.4 mg/dL (ref 0.0–1.2)
CALCIUM: 9.3 mg/dL (ref 8.7–10.2)
CO2: 22 mmol/L (ref 20–29)
CREATININE: 1.01 mg/dL (ref 0.76–1.27)
Chloride: 102 mmol/L (ref 96–106)
GFR, EST AFRICAN AMERICAN: 107 mL/min/{1.73_m2} (ref >59)
GFR, EST NON AFRICAN AMERICAN: 93 mL/min/{1.73_m2} (ref >59)
Globulin, Total: 2.3 g/dL (ref 1.5–4.5)
Glucose: 87 mg/dL (ref 65–99)
Potassium: 3.8 mmol/L (ref 3.5–5.2)
Sodium: 139 mmol/L (ref 134–144)
TOTAL PROTEIN: 6.8 g/dL (ref 6.0–8.5)

## 2018-12-25 LAB — CBC
Hematocrit: 42.1 % (ref 37.5–51.0)
Hemoglobin: 14 g/dL (ref 13.0–17.7)
MCH: 26.5 pg — AB (ref 26.6–33.0)
MCHC: 33.3 g/dL (ref 31.5–35.7)
MCV: 80 fL (ref 79–97)
PLATELETS: 288 10*3/uL (ref 150–450)
RBC: 5.28 x10E6/uL (ref 4.14–5.80)
RDW: 12.9 % (ref 11.6–15.4)
WBC: 8.2 10*3/uL (ref 3.4–10.8)

## 2018-12-25 LAB — LIPID PANEL
CHOL/HDL RATIO: 4.1 ratio (ref 0.0–5.0)
Cholesterol, Total: 219 mg/dL — ABNORMAL HIGH (ref 100–199)
HDL: 53 mg/dL (ref >39)
LDL CALC: 154 mg/dL — AB (ref 0–99)
TRIGLYCERIDES: 61 mg/dL (ref 0–149)
VLDL CHOLESTEROL CAL: 12 mg/dL (ref 5–40)

## 2018-12-25 LAB — MAGNESIUM: MAGNESIUM: 1.8 mg/dL (ref 1.6–2.3)

## 2018-12-25 LAB — TSH: TSH: 0.909 u[IU]/mL (ref 0.450–4.500)

## 2019-03-09 IMAGING — CR DG CHEST 1V
1 series · 1 of 1 positions shown · non-contrast
Comparison: 05/02/2017

CLINICAL DATA: Pre-[REDACTED] screening examination.

EXAM:
CHEST  1 VIEW

[w chest pa]
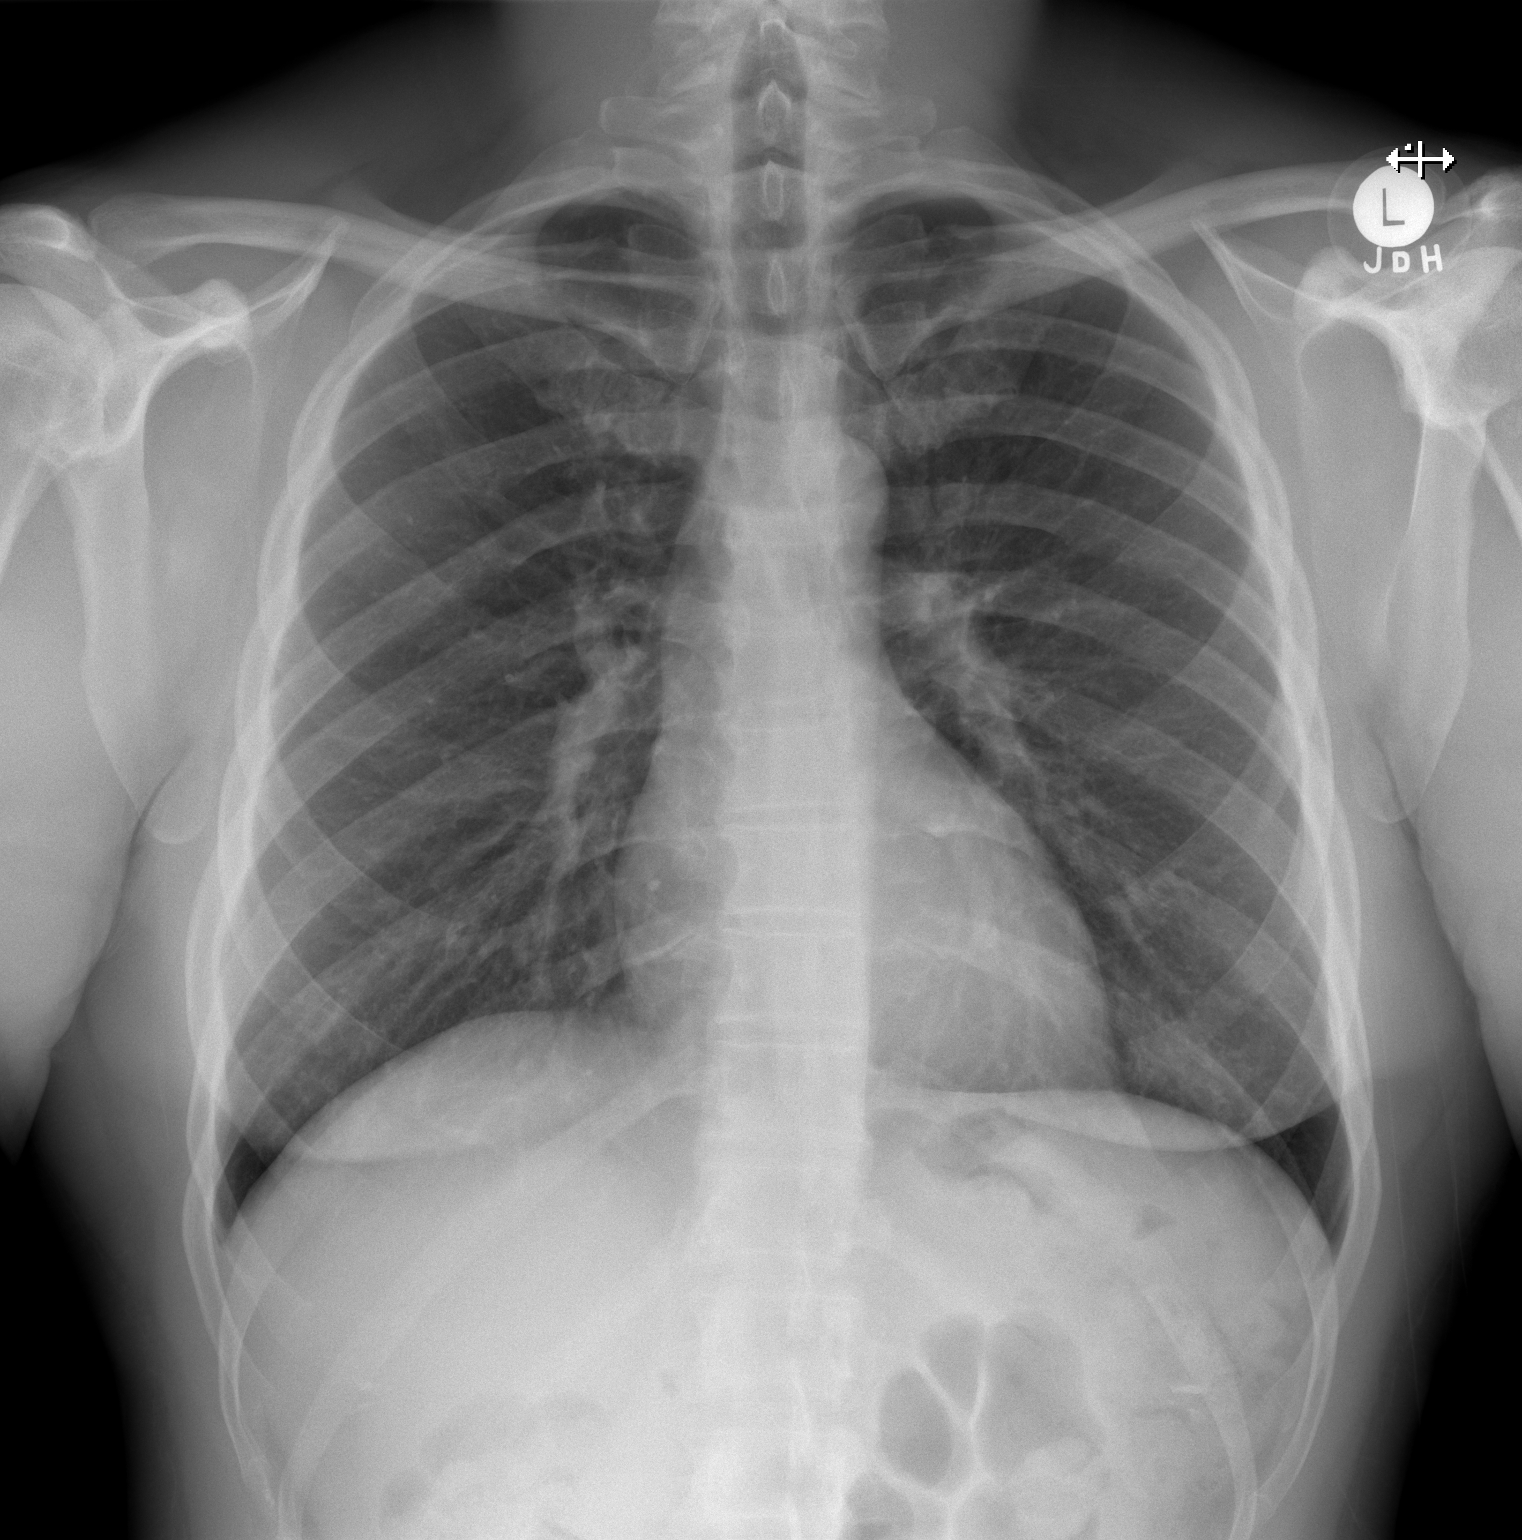

[1 of 1 positions shown; findings below may reference images not displayed]

FINDINGS: Normal heart size and mediastinal contours. No acute infiltrate or
edema. No effusion or pneumothorax. No acute osseous findings.
IMPRESSION: Negative chest.

## 2019-03-18 ENCOUNTER — Telehealth: Payer: Self-pay | Admitting: Family Medicine

## 2019-03-18 NOTE — Telephone Encounter (Signed)
Copied from CRM [id number]. Topic: General - Other >> Mar 18, 2019  8:52 AM Tamela Oddi wrote: Reason for CRM: Patient called to request a letter for his employer stating that he has high BP and also epilepsy.  Patient stated that his employer needs this information for FMLA.  Please advise and call patient to let him know when this information will be available.  CB# [phone]

## 2019-03-20 NOTE — Telephone Encounter (Signed)
LVM for patient letting him know he note was ready for pick up

## 2019-07-16 ENCOUNTER — Telehealth: Payer: Self-pay

## 2019-07-16 NOTE — Telephone Encounter (Signed)
I called pt, rescheduled his appt from 8/28 since we are closed. Pt requesting a mychart visit.  Pt understands that although there may be some limitations with this type of visit, we will take all precautions to reduce any security or privacy concerns.  Pt understands that this will be treated like an in office visit and we will file with pt's insurance, and there may be a patient responsible charge related to this service.  Pt verbalized understanding of how to conduct a mychart visit and of new appt date and time.

## 2019-07-18 ENCOUNTER — Ambulatory Visit: Payer: BLUE CROSS/BLUE SHIELD | Admitting: Neurology

## 2019-07-30 ENCOUNTER — Encounter: Payer: Self-pay | Admitting: Neurology

## 2019-07-30 ENCOUNTER — Telehealth (INDEPENDENT_AMBULATORY_CARE_PROVIDER_SITE_OTHER): Payer: BC Managed Care – PPO | Admitting: Neurology

## 2019-07-30 DIAGNOSIS — G40309 Generalized idiopathic epilepsy and epileptic syndromes, not intractable, without status epilepticus: Secondary | ICD-10-CM

## 2019-07-30 MED ORDER — LEVETIRACETAM 500 MG PO TABS: 500.0000 mg | ORAL_TABLET | Freq: Two times a day (BID) | ORAL | 3 refills | Status: DC

## 2019-07-30 NOTE — Progress Notes (Signed)
     Virtual Visit via Video Note  I connected with Jim Fisher on 07/30/19 at  7:30 AM EDT by a video enabled telemedicine application and verified that I am speaking with the correct person using two identifiers.  Location: Patient: The patient is at home. Provider: Physician in office.   I discussed the limitations of evaluation and management by telemedicine and the availability of in person appointments. The patient expressed understanding and agreed to proceed.  History of Present Illness: Jim Fisher is a 43 year old black male with a history of a seizure disorder, he has not had any seizures for 10 years.  He is on Keppra taking 750 mg twice daily, he tolerates the drug well.  He works at [employer]  He was excepted into the [employer] but did not get through the training.  He has otherwise had no other significant medical issues, he does take blood pressure medications.  He is inquiring about whether or not he could potentially come off of his seizure medications.  His last EEG study a year ago was normal.   Observations/Objective: The examination today reveals that the patient is alert and cooperative.  Face is symmetric, extraocular movements are full.  Speech is well enunciated, not aphasic.  The patient is able to protrude the tongue in midline with good lateral movement the tongue.  He has good finger-nose-finger and heel shin bilaterally.  Gait is normal.  Tandem gait is normal.  Romberg is negative.  No drift is seen.  Assessment and Plan: 1.  History of seizures, well controlled  The patient may be able to come off of his medications if he desires.  We will lower the dose of the 500 mg twice daily regimen, if he desires to come off the medication completely he will contact our office.  Otherwise, he will follow-up in 1 year.  Follow Up Instructions: 1 year follow-up, may see nurse practitioner.   I discussed the assessment and treatment plan with  the patient. The patient was provided an opportunity to ask questions and all were answered. The patient agreed with the plan and demonstrated an understanding of the instructions.   The patient was advised to call back or seek an in-person evaluation if the symptoms worsen or if the condition fails to improve as anticipated.  I provided 15 minutes of non-face-to-face time during this encounter.   Kathrynn Ducking, MD

## 2019-08-01 ENCOUNTER — Telehealth: Payer: Self-pay | Admitting: Neurology

## 2019-08-12 ENCOUNTER — Telehealth: Payer: Self-pay | Admitting: Neurology

## 2019-08-12 NOTE — Telephone Encounter (Signed)
I called patient and LVM to schedule his 1 year follow-up per Dr. Jannifer Franklin from his 07/30/19 visit. Pt may be scheduled with Judson Roch NP. Requested he call back to schedule.

## 2019-09-15 ENCOUNTER — Other Ambulatory Visit: Payer: Self-pay | Admitting: Family Medicine

## 2019-09-15 DIAGNOSIS — I10 Essential (primary) hypertension: Secondary | ICD-10-CM

## 2019-09-15 NOTE — Telephone Encounter (Signed)
Requested medication (s) are due for refill today: yes  Requested medication (s) are on the active medication list: yes  Last refill:  06/23/2019  Future visit scheduled: no  Notes to clinic: Patient overdue for office visit Review for refill   Requested Prescriptions  Pending Prescriptions Disp Refills   lisinopril (ZESTRIL) 10 MG tablet [Pharmacy Med Name: LISINOPRIL 10 MG TABLET] 90 tablet 2    Sig: TAKE 1 TABLET BY MOUTH DAILY     Cardiovascular:  ACE Inhibitors Failed - 09/15/2019  1:31 AM      Failed - Cr in normal range and within 180 days    Creat  Date Value Ref Range Status  03/19/2014 1.18 0.50 - 1.35 mg/dL Final   Creatinine, Ser  Date Value Ref Range Status  12/24/2018 1.01 0.76 - 1.27 mg/dL Final         Failed - K in normal range and within 180 days    Potassium  Date Value Ref Range Status  12/24/2018 3.8 3.5 - 5.2 mmol/L Final         Failed - Valid encounter within last 6 months    Recent Outpatient Visits          8 months ago Essential hypertension   [facility], Ranell Patrick, MD   1 year ago Tongue pain   [facility] at [facility], MD   1 year ago Annual physical exam   [facility] [place], MD   2 years ago Acute bronchitis, unspecified organism   [facility] [place], [place], MD   2 years ago PVC (premature ventricular contraction)   [facility] at [place], Lanelle Bal, [place] - Patient is not pregnant      Passed - Last BP in normal range    BP Readings from Last 1 Encounters:  12/24/18 138/82

## 2019-09-15 NOTE — Telephone Encounter (Signed)
No refills without office visit. Please schedule appt

## 2019-09-19 NOTE — Telephone Encounter (Signed)
Called 2x no answer left voice mail to schedule appt for med refills

## 2019-09-22 ENCOUNTER — Other Ambulatory Visit: Payer: Self-pay

## 2019-09-22 ENCOUNTER — Ambulatory Visit: Payer: BC Managed Care – PPO | Admitting: Family Medicine

## 2019-09-22 ENCOUNTER — Encounter: Payer: Self-pay | Admitting: Family Medicine

## 2019-09-22 VITALS — BP 140/91 | HR 108 | Temp 98.4°F | Wt 217.0 lb

## 2019-09-22 DIAGNOSIS — E785 Hyperlipidemia, unspecified: Secondary | ICD-10-CM

## 2019-09-22 DIAGNOSIS — Z23 Encounter for immunization: Secondary | ICD-10-CM

## 2019-09-22 DIAGNOSIS — I10 Essential (primary) hypertension: Secondary | ICD-10-CM | POA: Diagnosis not present

## 2019-09-22 MED ORDER — LISINOPRIL 5 MG PO TABS: 5.0000 mg | ORAL_TABLET | Freq: Every day | ORAL | 2 refills | Status: DC

## 2019-09-22 MED ORDER — LISINOPRIL 10 MG PO TABS: 10.0000 mg | ORAL_TABLET | Freq: Every day | ORAL | 2 refills | Status: DC

## 2019-09-22 NOTE — Progress Notes (Signed)
Subjective:    Patient ID: Rosalio MacadamiaDarryl M Wurster, male    DOB: 1976-06-26, 43 y.o.   MRN: [id number]  HPI Romyn Lorane GellM Manfredonia is a 43 y.o. male Presents today for: Chief Complaint  Patient presents with  . Medical Management of Chronic Issues    medication refill lisinopril    Hypertension: BP Readings from Last 3 Encounters:  09/22/19 (!) 140/91  12/24/18 138/82  08/16/18 (!) 158/74   Lab Results  Component Value Date   CREATININE 1.01 12/24/2018  home readings: up to 140/80 at times. Usually 130's.   Anxious about covid, but taking precautions.   Started back exercise yesterday. Less exercise past few years.  Wt Readings from Last 3 Encounters:  09/22/19 217 lb (98.4 kg)  12/24/18 212 lb (96.2 kg)  08/16/18 213 lb (96.6 kg)  Body mass index is 29.43 kg/m.  Prior heart palpitations. Gone with lower dose of Keppra (neuro decreased dose as no sz in many years)    Hyperlipidemia:  Lab Results  Component Value Date   CHOL 219 (H) 12/24/2018   HDL 53 12/24/2018   LDLCALC 154 (H) 12/24/2018   TRIG 61 12/24/2018   CHOLHDL 4.1 12/24/2018   Lab Results  Component Value Date   ALT 23 12/24/2018   AST 18 12/24/2018   ALKPHOS 33 (L) 12/24/2018   BILITOT 0.4 12/24/2018  The 10-year ASCVD risk score Denman George(Goff DC Jr., et al., 2013) is: 6.5%   Values used to calculate the score:     Age: [age] years     Sex: Male     Is Non-Hispanic African American: Yes     Diabetic: No     Tobacco smoker: No     Systolic Blood Pressure: 140 mmHg     Is BP treated: Yes     HDL Cholesterol: 53 mg/dL     Total Cholesterol: 219 mg/dL Kind bar few mins ago, oatmeal at 12.    Patient Active Problem List   Diagnosis Date Noted  . Tongue pain 08/16/2018  . Cough 05/02/2017  . Acute bronchitis 05/02/2017  . PVC (premature ventricular contraction) 09/22/2016  . 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 Anxiety   . Epilepsy (HCC)   .  Hypertension   . Palpitations   . Seizures (HCC)    Past Surgical History:  Procedure Laterality Date  . SHOULDER ARTHROSCOPY W/ ACROMIAL REPAIR  2008   No Known Allergies Prior to Admission medications   Medication Sig Start Date End Date Taking? Authorizing Provider  levETIRAcetam (KEPPRA) 500 MG tablet Take 1 tablet (500 mg total) by mouth 2 (two) times daily. 07/30/19  Yes York SpanielWillis, Charles K, MD  lisinopril (PRINIVIL,ZESTRIL) 10 MG tablet Take 1 tablet (10 mg total) by mouth daily. Must Keep 12/24/18 appointment for additional refills 12/24/18  Yes Shade FloodGreene, Bucky Grigg R, MD   Social History   Socioeconomic History  . Marital status: Married    Spouse name: Diane   . Number of children: 1  . Years of education: Not on file  . Highest education level: Not on file  Occupational History  . Occupation: Investment banker, corporateALES    Employer: [employer]  Social Needs  . Financial resource strain: Not on file  . Food insecurity    Worry: Not on file    Inability: Not on file  . Transportation needs    Medical: Not on file    Non-medical: Not on file  Tobacco  Use  . Smoking status: Former Smoker    Years: 2.00    Types: Cigarettes    Quit date: 11/20/1996    Years since quitting: 22.8  . Smokeless tobacco: Never Used  . Tobacco comment: SMOKED IN HIGH SCHOOL AT LEAST 3 CIGARETTES PER DAY  Substance and Sexual Activity  . Alcohol use: No    Alcohol/week: 1.0 standard drinks    Types: 1 Cans of beer per week    Comment: every 2 weeks   . Drug use: No  . Sexual activity: Yes    Comment: number of sex partners in the last 12 months 1  Lifestyle  . Physical activity    Days per week: Not on file    Minutes per session: Not on file  . Stress: Not on file  Relationships  . Social Musician on phone: Not on file    Gets together: Not on file    Attends religious service: Not on file    Active member of club or organization: Not on file    Attends meetings of clubs or organizations: Not on file     Relationship status: Not on file  . Intimate partner violence    Fear of current or ex partner: Not on file    Emotionally abused: Not on file    Physically abused: Not on file    Forced sexual activity: Not on file  Other Topics Concern  . Not on file  Social History Narrative   Exercise doing weights and cardio 5 x/week for 1 hour.    Patient works at [employer].   Patient has some college.   Patient is married with one child.     Review of Systems  Constitutional: Negative for fatigue and unexpected weight change.  Eyes: Negative for visual disturbance.  Respiratory: Negative for cough, chest tightness and shortness of breath.   Cardiovascular: Negative for chest pain, palpitations and leg swelling.  Gastrointestinal: Negative for abdominal pain and blood in stool.  Neurological: Negative for dizziness, light-headedness and headaches.       Objective:   Physical Exam Vitals signs reviewed.  Constitutional:      Appearance: He is well-developed.  HENT:     Head: Normocephalic and atraumatic.  Eyes:     Pupils: Pupils are equal, round, and reactive to light.  Neck:     Vascular: No carotid bruit or JVD.  Cardiovascular:     Rate and Rhythm: Normal rate and regular rhythm.     Heart sounds: Normal heart sounds. No murmur.  Pulmonary:     Effort: Pulmonary effort is normal.     Breath sounds: Normal breath sounds. No rales.  Skin:    General: Skin is warm and dry.  Neurological:     Mental Status: He is alert and oriented to person, place, and time.    Vitals:   09/22/19 1519 09/22/19 1525  BP: (!) 168/81 (!) 140/91  Pulse: (!) 108   Temp: 98.4 F (36.9 C)   TempSrc: Oral   SpO2: 97%   Weight: 217 lb (98.4 kg)        Assessment & Plan:   TYREECE GELLES is a 43 y.o. male Essential hypertension - Plan: lisinopril (ZESTRIL) 5 MG tablet, lisinopril (ZESTRIL) 10 MG tablet  decr control - add additional 5mg  for total dose 15mg  lisinopril. Wt loss with exercise  should help. Labs above.   Hyperlipidemia, unspecified hyperlipidemia type - Plan: Comprehensive metabolic panel,  Lipid panel  - low 10year ascvd score prior. Check labs, may need to repeat fasting   Need for prophylactic vaccination and inoculation against influenza - Plan: Flu Vaccine QUAD 6+ mos PF IM (Fluarix Quad PF)   Meds ordered this encounter  Medications  . lisinopril (ZESTRIL) 5 MG tablet    Sig: Take 1 tablet (5 mg total) by mouth daily.    Dispense:  90 tablet    Refill:  2  . lisinopril (ZESTRIL) 10 MG tablet    Sig: Take 1 tablet (10 mg total) by mouth daily.    Dispense:  90 tablet    Refill:  2   Patient Instructions     Try increased dose of lisinopril at 15 mg total per day. Keep a record of your blood pressures outside of the office and bring them to the next office visit, or can send to me by Mychart message.  Restart of exercise should help, but I think weight loss will also help blood pressure and may be able to get back to lisinopril 10 mg per day eventually.  I will let you know if there are concerns on the blood work.  Follow-up in 6 months, but let me know if there are questions sooner.  Thanks for coming in today.   If you have lab work done today you will be contacted with your lab results within the next 2 weeks.  If you have not heard from [place] then please contact us. The fastest way to get your results is to register for My Chart.   IF you received an x-ray today, you will receive an invoice from [facility]. Please contact [facility] at [phone] with questions or concerns regarding your invoice.   IF you received labwork today, you will receive an invoice from [place]. Please contact [facility] at [phone] with questions or concerns regarding your invoice.   Our billing staff will not be able to assist you with questions regarding bills from these companies.  You will be contacted with the lab results as soon as they are  available. The fastest way to get your results is to activate your My Chart account. Instructions are located on the last page of this paperwork. If you have not heard from [place] regarding the results in 2 weeks, please contact this office.       Signed,   Merri Ray, MD [facility] at [place].  09/22/19 4:30 PM

## 2019-09-22 NOTE — Patient Instructions (Addendum)
   Try increased dose of lisinopril at 15 mg total per day. Keep a record of your blood pressures outside of the office and bring them to the next office visit, or can send to me by Mychart message.  Restart of exercise should help, but I think weight loss will also help blood pressure and may be able to get back to lisinopril 10 mg per day eventually.  I will let you know if there are concerns on the blood work.  Follow-up in 6 months, but let me know if there are questions sooner.  Thanks for coming in today.   If you have lab work done today you will be contacted with your lab results within the next 2 weeks.  If you have not heard from [place] then please contact us. The fastest way to get your results is to register for My Chart.   IF you received an x-ray today, you will receive an invoice from [facility]. Please contact [facility] at [phone] with questions or concerns regarding your invoice.   IF you received labwork today, you will receive an invoice from [facility]. Please contact [facility] at [phone] with questions or concerns regarding your invoice.   Our billing staff will not be able to assist you with questions regarding bills from these companies.  You will be contacted with the lab results as soon as they are available. The fastest way to get your results is to activate your My Chart account. Instructions are located on the last page of this paperwork. If you have not heard from [place] regarding the results in 2 weeks, please contact this office.

## 2019-09-23 LAB — LIPID PANEL
Chol/HDL Ratio: 4.2 ratio (ref 0.0–5.0)
Cholesterol, Total: 228 mg/dL — ABNORMAL HIGH (ref 100–199)
HDL: 54 mg/dL (ref >39)
LDL Chol Calc (NIH): 144 mg/dL — ABNORMAL HIGH (ref 0–99)
Triglycerides: 169 mg/dL — ABNORMAL HIGH (ref 0–149)
VLDL Cholesterol Cal: 30 mg/dL (ref 5–40)

## 2019-09-23 LAB — COMPREHENSIVE METABOLIC PANEL
ALT: 19 IU/L (ref 0–44)
AST: 21 IU/L (ref 0–40)
Albumin/Globulin Ratio: 1.7 (ref 1.2–2.2)
Albumin: 4.5 g/dL (ref 4.0–5.0)
Alkaline Phosphatase: 41 IU/L (ref 39–117)
BUN/Creatinine Ratio: 7 — ABNORMAL LOW (ref 9–20)
BUN: 8 mg/dL (ref 6–24)
Bilirubin Total: <0.2 mg/dL (ref 0.0–1.2)
CO2: 24 mmol/L (ref 20–29)
Calcium: 9.7 mg/dL (ref 8.7–10.2)
Chloride: 103 mmol/L (ref 96–106)
Creatinine, Ser: 1.12 mg/dL (ref 0.76–1.27)
GFR calc Af Amer: 94 mL/min/{1.73_m2} (ref >59)
GFR calc non Af Amer: 81 mL/min/{1.73_m2} (ref >59)
Globulin, Total: 2.6 g/dL (ref 1.5–4.5)
Glucose: 96 mg/dL (ref 65–99)
Potassium: 4 mmol/L (ref 3.5–5.2)
Sodium: 140 mmol/L (ref 134–144)
Total Protein: 7.1 g/dL (ref 6.0–8.5)

## 2019-09-29 ENCOUNTER — Encounter: Payer: Self-pay | Admitting: Radiology

## 2019-12-04 ENCOUNTER — Encounter: Payer: Self-pay | Admitting: Family Medicine

## 2020-01-26 ENCOUNTER — Ambulatory Visit: Payer: No Typology Code available for payment source | Attending: Internal Medicine

## 2020-01-26 DIAGNOSIS — Z20822 Contact with and (suspected) exposure to covid-19: Secondary | ICD-10-CM | POA: Insufficient documentation

## 2020-01-27 LAB — NOVEL CORONAVIRUS, NAA: SARS-CoV-2, NAA: NOT DETECTED

## 2020-01-28 ENCOUNTER — Encounter: Payer: Self-pay | Admitting: Family Medicine

## 2020-02-02 ENCOUNTER — Ambulatory Visit: Payer: No Typology Code available for payment source | Attending: Internal Medicine

## 2020-02-02 DIAGNOSIS — Z20822 Contact with and (suspected) exposure to covid-19: Secondary | ICD-10-CM | POA: Insufficient documentation

## 2020-02-03 LAB — NOVEL CORONAVIRUS, NAA: SARS-CoV-2, NAA: NOT DETECTED

## 2020-03-08 ENCOUNTER — Other Ambulatory Visit: Payer: Self-pay

## 2020-03-08 ENCOUNTER — Ambulatory Visit: Payer: BC Managed Care – PPO | Admitting: Neurology

## 2020-03-08 ENCOUNTER — Encounter: Payer: Self-pay | Admitting: Neurology

## 2020-03-08 VITALS — BP 140/82 | HR 84 | Temp 97.1°F | Ht 72.0 in | Wt 220.0 lb

## 2020-03-08 DIAGNOSIS — G40309 Generalized idiopathic epilepsy and epileptic syndromes, not intractable, without status epilepticus: Secondary | ICD-10-CM

## 2020-03-08 MED ORDER — LEVETIRACETAM 750 MG PO TABS: 750.0000 mg | ORAL_TABLET | Freq: Two times a day (BID) | ORAL | 4 refills | Status: DC

## 2020-03-08 NOTE — Progress Notes (Signed)
PATIENT: Jim Fisher DOB: 1976-05-27  REASON FOR VISIT: follow up HISTORY FROM: patient  HISTORY OF PRESENT ILLNESS: Today 03/08/20  Jim Fisher is a 44 year old male with history of seizure disorder.  When last seen, his dose of Keppra was decreased to 500 mg twice a day, as it had been about 10 years since his last seizure.  Unfortunately, he suffered a grand mal seizure 03/04/2020.  However, the day before, he missed 2 doses of Keppra.  He remembers on the morning of the seizure, feeling the aura, his wife was able to assist him to the ground.  It lasted about 5 minutes, afterwards he was confused, somewhat combative, wanting to get up.  EMS came and, by the time they arrived, he was coming back around.  He was not transported to the hospital.  He did bite his tongue, no urinary incontinence.  He did not hit his head. He has been having some muscle tightness to his left should blade. For the last 1-2 years having occasional palpations.  He works at [employer]  He presents today for evaluation accompanied by his wife.  HISTORY  07/30/2019 Dr. Jannifer Franklin: Jim Fisher is a 44 year old black male with a history of a seizure disorder, he has not had any seizures for 10 years.  He is on Keppra taking 750 mg twice daily, he tolerates the drug well.  He works at [employer].  He was excepted into the [employer] but did not get through the training.  He has otherwise had no other significant medical issues, he does take blood pressure medications.  He is inquiring about whether or not he could potentially come off of his seizure medications.  His last EEG study a year ago was normal.  REVIEW OF SYSTEMS: Out of a complete 14 system review of symptoms, the patient complains only of the following symptoms, and all other reviewed systems are negative.  Seizure  ALLERGIES: No Known Allergies  HOME MEDICATIONS: Outpatient Medications Prior to Visit  Medication Sig Dispense Refill  .  lisinopril (ZESTRIL) 10 MG tablet Take 1 tablet (10 mg total) by mouth daily. 90 tablet 2  . lisinopril (ZESTRIL) 5 MG tablet Take 1 tablet (5 mg total) by mouth daily. 90 tablet 2  . levETIRAcetam (KEPPRA) 500 MG tablet Take 1 tablet (500 mg total) by mouth 2 (two) times daily. 180 tablet 3   No facility-administered medications prior to visit.    PAST MEDICAL HISTORY: Past Medical History:  Diagnosis Date  . Anxiety   . Epilepsy ([place])   . Hypertension   . Palpitations   . Seizures ([place])     PAST SURGICAL HISTORY: Past Surgical History:  Procedure Laterality Date  . SHOULDER ARTHROSCOPY W/ ACROMIAL REPAIR  2008    FAMILY HISTORY: Family History  Problem Relation Age of Onset  . Hypertension Maternal Grandmother   . Diabetes Maternal Grandmother     SOCIAL HISTORY: Social History   Socioeconomic History  . Marital status: Married    Spouse name: Diane   . Number of children: 1  . Years of education: Not on file  . Highest education level: Not on file  Occupational History  . Occupation: Scientist, clinical (histocompatibility and immunogenetics): [employer]  Tobacco Use  . Smoking status: Former Smoker    Years: 2.00    Types: Cigarettes    Quit date: 11/20/1996    Years since quitting: 23.3  . Smokeless tobacco: Never Used  . Tobacco comment:  SMOKED IN HIGH SCHOOL AT LEAST 3 CIGARETTES PER DAY  Substance and Sexual Activity  . Alcohol use: No    Alcohol/week: 1.0 standard drinks    Types: 1 Cans of beer per week    Comment: every 2 weeks   . Drug use: No  . Sexual activity: Yes    Comment: number of sex partners in the last 12 months 1  Other Topics Concern  . Not on file  Social History Narrative   Exercise doing weights and cardio 5 x/week for 1 hour.    Patient works at [employer].   Patient has some college.   Patient is married with one child.    Social Determinants of Health   Financial Resource Strain:   . Difficulty of Paying Living Expenses:   Food Insecurity:   . Worried About Community education officer in the Last Year:   . Barista in the Last Year:   Transportation Needs:   . Freight forwarder (Medical):   Marland Kitchen Lack of Transportation (Non-Medical):   Physical Activity:   . Days of Exercise per Week:   . Minutes of Exercise per Session:   Stress:   . Feeling of Stress :   Social Connections:   . Frequency of Communication with Friends and Family:   . Frequency of Social Gatherings with Friends and Family:   . Attends Religious Services:   . Active Member of Clubs or Organizations:   . Attends Banker Meetings:   Marland Kitchen Marital Status:   Intimate Partner Violence:   . Fear of Current or Ex-Partner:   . Emotionally Abused:   Marland Kitchen Physically Abused:   . Sexually Abused:    PHYSICAL EXAM  Vitals:   03/08/20 0958  BP: 140/82  Pulse: 84  Temp: (!) 97.1 F (36.2 C)  Weight: 220 lb (99.8 kg)  Height: 6' (1.829 m)   Body mass index is 29.84 kg/m.  Generalized: Well developed, in no acute distress   Neurological examination  Mentation: Alert oriented to time, place, history taking. Follows all commands speech and language fluent Cranial nerve II-XII: Pupils were equal round reactive to light. Extraocular movements were full, visual field were full on confrontational test. Facial sensation and strength were normal. Head turning and shoulder shrug  were normal and symmetric. Motor: The motor testing reveals 5 over 5 strength of all 4 extremities. Good symmetric motor tone is noted throughout.  Sensory: Sensory testing is intact to soft touch on all 4 extremities. No evidence of extinction is noted.  Coordination: Cerebellar testing reveals good finger-nose-finger and heel-to-shin bilaterally.  Gait and station: Gait is normal. Tandem gait is normal.  Reflexes: Deep tendon reflexes are symmetric and normal bilaterally.   DIAGNOSTIC DATA (LABS, IMAGING, TESTING) - I reviewed patient records, labs, notes, testing and imaging myself where available.   Lab Results  Component Value Date   WBC 8.2 12/24/2018   HGB 14.0 12/24/2018   HCT 42.1 12/24/2018   MCV 80 12/24/2018   PLT 288 12/24/2018      Component Value Date/Time   NA 140 09/22/2019 1638   K 4.0 09/22/2019 1638   CL 103 09/22/2019 1638   CO2 24 09/22/2019 1638   GLUCOSE 96 09/22/2019 1638   GLUCOSE 104 (H) 09/18/2016 1510   BUN 8 09/22/2019 1638   CREATININE 1.12 09/22/2019 1638   CREATININE 1.18 03/19/2014 1906   CALCIUM 9.7 09/22/2019 1638   PROT 7.1 09/22/2019 1638  ALBUMIN 4.5 09/22/2019 1638   AST 21 09/22/2019 1638   ALT 19 09/22/2019 1638   ALKPHOS 41 09/22/2019 1638   BILITOT <0.2 09/22/2019 1638   GFRNONAA 81 09/22/2019 1638   GFRAA 94 09/22/2019 1638   Lab Results  Component Value Date   CHOL 228 (H) 09/22/2019   HDL 54 09/22/2019   LDLCALC 144 (H) 09/22/2019   TRIG 169 (H) 09/22/2019   CHOLHDL 4.2 09/22/2019   No results found for: HGBA1C No results found for: VITAMINB12 Lab Results  Component Value Date   TSH 0.909 12/24/2018      ASSESSMENT AND PLAN 44 y.o. year old male  has a past medical history of Anxiety, Epilepsy (HCC), Hypertension, Palpitations, and Seizures (HCC). here with:  1.  History of seizures, recent seizure 03/04/2020  He suffered a recent seizure while taking Keppra 500 mg twice a day.  However, the day before he did miss 2 doses of the medication.  At his last office visit, his dose of Keppra was decreased from 750 mg twice a day, as he has been 10 years without seizure.  We will go back up on the Keppra, he will take 750 mg twice daily, not to miss any doses.  No driving until seizure-free for 6 months.  He will call for recurrent seizure, otherwise follow-up in 6 months.  I spent 30 minutes of face-to-face and non-face-to-face time with patient.  This included previsit chart review, lab review, study review, order entry, electronic health record documentation, patient education.  Margie Ege, AGNP-C, DNP 03/08/2020,  10:33 AM [facility] [address] [place], [place] [address] [phone]

## 2020-03-08 NOTE — Patient Instructions (Signed)
Increase Keppra back to 750 mg twice daily  No driving for 6 months See you back in 6 months

## 2020-03-10 ENCOUNTER — Ambulatory Visit: Payer: BC Managed Care – PPO | Admitting: Adult Health Nurse Practitioner

## 2020-03-10 NOTE — Progress Notes (Signed)
I have read the note, and I agree with the clinical assessment and plan.  Jim Fisher K Jacub Waiters   

## 2020-03-19 DIAGNOSIS — Z20828 Contact with and (suspected) exposure to other viral communicable diseases: Secondary | ICD-10-CM | POA: Diagnosis not present

## 2020-03-19 DIAGNOSIS — Z03818 Encounter for observation for suspected exposure to other biological agents ruled out: Secondary | ICD-10-CM | POA: Diagnosis not present

## 2020-03-22 ENCOUNTER — Encounter: Payer: Self-pay | Admitting: Family Medicine

## 2020-03-22 ENCOUNTER — Other Ambulatory Visit: Payer: Self-pay

## 2020-03-22 ENCOUNTER — Ambulatory Visit: Payer: BC Managed Care – PPO | Admitting: Family Medicine

## 2020-03-22 ENCOUNTER — Ambulatory Visit (INDEPENDENT_AMBULATORY_CARE_PROVIDER_SITE_OTHER): Payer: BC Managed Care – PPO

## 2020-03-22 VITALS — BP 126/82 | HR 87 | Temp 98.3°F | Ht 72.0 in | Wt 223.0 lb

## 2020-03-22 DIAGNOSIS — M549 Dorsalgia, unspecified: Secondary | ICD-10-CM

## 2020-03-22 DIAGNOSIS — Z87898 Personal history of other specified conditions: Secondary | ICD-10-CM

## 2020-03-22 DIAGNOSIS — M546 Pain in thoracic spine: Secondary | ICD-10-CM | POA: Diagnosis not present

## 2020-03-22 NOTE — Progress Notes (Signed)
Subjective:  Patient ID: Jim Fisher, male    DOB: 08/26/1976  Age: 44 y.o. MRN: [id number]  CC:  Chief Complaint  Patient presents with  . Back Pain    pt had a sezer 2-3 weeks ago . pt states he felt a sezer coming on and tried to get down stairs to where his wife was. on the landing of his stairs pt had the seizer and pt fell back first into the wall. pt reports he didn't hit his head and has already seen his nerologist for the seizer. pt had extream back pain the first 2 weeks after and a pain in his chest and back when taking a deep breeath. as of this last week he has felt better, but can still feel pain in his back when taking deep breath.      HPI Jim Fisher presents for   Back Pain: Seizure 2-3 weeks ago, 1st one in 11 years. Decreased Keppra dose last year. One morning - not sure if he had taken meds. Took meds next am. Felt aura, went down stairs. Fell at bottom of stairs during seizure. fell backwards into the wall - witnessed by spouse. Back flexed and shoulders rotated inward at the time. No head injury.  Back pain - left side mid back below shoulder blade. Hurt to take deep breathe/sneeze.  Ibuprofen 800mg , tylenol helped initially. Side pain better, but now in mid back with deep breath, radiates forward.  No current meds.  No hemoptysis. No cough, no dyspnea.  No low back pain, no arm/leg radiation or weakness.   Keppra dose increased by neuro - back to 750mg  BID.      History Patient Active Problem List   Diagnosis Date Noted  . Tongue pain 08/16/2018  . Cough 05/02/2017  . Acute bronchitis 05/02/2017  . PVC (premature ventricular contraction) 09/22/2016  . 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 Anxiety   . Epilepsy (HCC)   . Hypertension   . Palpitations   . Seizures (HCC)    Past Surgical History:  Procedure Laterality Date  . SHOULDER ARTHROSCOPY W/ ACROMIAL REPAIR  2008   No Known  Allergies Prior to Admission medications   Medication Sig Start Date End Date Taking? Authorizing Provider  levETIRAcetam (KEPPRA) 750 MG tablet Take 1 tablet (750 mg total) by mouth 2 (two) times daily. 03/08/20   2009, NP  lisinopril (ZESTRIL) 10 MG tablet Take 1 tablet (10 mg total) by mouth daily. 09/22/19   Glean Salvo, MD  lisinopril (ZESTRIL) 5 MG tablet Take 1 tablet (5 mg total) by mouth daily. 09/22/19   Shade Flood, MD   Social History   Socioeconomic History  . Marital status: Married    Spouse name: Diane   . Number of children: 1  . Years of education: Not on file  . Highest education level: Not on file  Occupational History  . Occupation: 13/2/20: [employer]  Tobacco Use  . Smoking status: Former Smoker    Years: 2.00    Types: Cigarettes    Quit date: 11/20/1996    Years since quitting: 23.3  . Smokeless tobacco: Never Used  . Tobacco comment: SMOKED IN HIGH SCHOOL AT LEAST 3 CIGARETTES PER DAY  Substance and Sexual Activity  . Alcohol use: No    Alcohol/week: 1.0 standard drinks    Types: 1 Cans of beer per week  Comment: every 2 weeks   . Drug use: No  . Sexual activity: Yes    Comment: number of sex partners in the last 12 months 1  Other Topics Concern  . Not on file  Social History Narrative   Exercise doing weights and cardio 5 x/week for 1 hour.    Patient works at [employer].   Patient has some college.   Patient is married with one child.    Social Determinants of Health   Financial Resource Strain:   . Difficulty of Paying Living Expenses:   Food Insecurity:   . Worried About Programme researcher, broadcasting/film/video in the Last Year:   . Barista in the Last Year:   Transportation Needs:   . Freight forwarder (Medical):   Marland Kitchen Lack of Transportation (Non-Medical):   Physical Activity:   . Days of Exercise per Week:   . Minutes of Exercise per Session:   Stress:   . Feeling of Stress :   Social Connections:   . Frequency of  Communication with Friends and Family:   . Frequency of Social Gatherings with Friends and Family:   . Attends Religious Services:   . Active Member of Clubs or Organizations:   . Attends Banker Meetings:   Marland Kitchen Marital Status:   Intimate Partner Violence:   . Fear of Current or Ex-Partner:   . Emotionally Abused:   Marland Kitchen Physically Abused:   . Sexually Abused:     Review of Systems Per HPI.   Objective:   Vitals:   03/22/20 1424 03/22/20 1432  BP: (!) 149/86 126/82  Pulse: 87   Temp: 98.3 F (36.8 C)   TempSrc: Temporal   SpO2: 98%   Weight: 223 lb (101.2 kg)   Height: 6' (1.829 m)     Physical Exam Vitals reviewed.  Constitutional:      Appearance: He is well-developed.  HENT:     Head: Normocephalic and atraumatic.  Eyes:     Pupils: Pupils are equal, round, and reactive to light.  Neck:     Vascular: No carotid bruit or JVD.  Cardiovascular:     Rate and Rhythm: Normal rate and regular rhythm.     Heart sounds: Normal heart sounds. No murmur.  Pulmonary:     Effort: Pulmonary effort is normal. No respiratory distress.     Breath sounds: Normal breath sounds. No stridor. No wheezing or rales.     Comments: Normal effort, no distress, equal breath sounds.  Speaking in full sentences.  Musculoskeletal:     Comments: Thoracic spine: Tender to palpation mid to lower thoracic spine midline, rhomboids nontender, no rash, ribs nontender.  Discomfort in the same area with trunk rotation greater than lateral flexion bilaterally.  Flexion, extension without difficulty.  Skin intact, no ecchymosis or soft tissue swelling apparent.  Skin:    General: Skin is warm and dry.  Neurological:     Mental Status: He is alert and oriented to person, place, and time.    DG Thoracic Spine 2 View  Result Date: 03/22/2020 CLINICAL DATA:  Back pain after fall. EXAM: THORACIC SPINE 2 VIEWS COMPARISON:  None. FINDINGS: There is no evidence of thoracic spine fracture. Alignment  is normal. No other significant bone abnormalities are identified. IMPRESSION: Negative. Electronically Signed   By: Lupita Raider M.D.   On: 03/22/2020 15:26    Assessment & Plan:  Jim Fisher is a 44 y.o. male . Back  pain, unspecified back location, unspecified back pain laterality, unspecified chronicity - Plan: DG Thoracic Spine 2 View  Mid back pain  History of seizure  Unfortunately recurrence of seizure few weeks ago, potentially due to change in dosage as well as missed doses of medication.  Has since followed up with neurology and adjustment of regimen.  Suspect he had a mid back contusion, with some improvement.  Initial radiating pain could have been neuronal impingement, but reassuring imaging at this time.  Consider CT versus other advanced imaging if persistent pain but symptomatic care discussed for present.  RTC precautions.  No orders of the defined types were placed in this encounter.  Patient Instructions    X-rays reassuring.  I suspect you had a contusion of the back, with some soft tissue injury.  Tylenol as needed, occasional ibuprofen if needed, gentle range of motion as tolerated.  If any worsening symptoms, or shortness of breath, be seen right away.  Return to the clinic or go to the nearest emergency room if any of your symptoms worsen or new symptoms occur. Contusion A contusion is a deep bruise. Contusions are the result of a blunt injury to tissues and muscle fibers under the skin. The injury causes bleeding under the skin. The skin overlying the contusion may turn blue, purple, or yellow. Minor injuries will give you a painless contusion, but more severe injuries cause contusions that may stay painful and swollen for a few weeks. Follow these instructions at home: Pay attention to any changes in your symptoms. Let your health care provider know about them. Take these actions to relieve your pain. Managing pain, stiffness, and swelling   Use resting,  icing, applying pressure (compression), and raising (elevating) the injured area. This is often called the RICE strategy. ? Rest the injured area. Return to your normal activities as told by your health care provider. Ask your health care provider what activities are safe for you. ? If directed, put ice on the injured area:  Put ice in a plastic bag.  Place a towel between your skin and the bag.  Leave the ice on for 20 minutes, 2-3 times per day. ? If directed, apply light compression to the injured area using an elastic bandage. Make sure the bandage is not wrapped too tightly. Remove and reapply the bandage as directed by your health care provider. ? If possible, raise (elevate) the injured area above the level of your heart while you are sitting or lying down. General instructions  Take over-the-counter and prescription medicines only as told by your health care provider.  Keep all follow-up visits as told by your health care provider. This is important. Contact a health care provider if:  Your symptoms do not improve after several days of treatment.  Your symptoms get worse.  You have difficulty moving the injured area. Get help right away if:  You have severe pain.  You have numbness in a hand or foot.  Your hand or foot turns pale or cold. Summary  A contusion is a deep bruise.  Contusions are the result of a blunt injury to tissues and muscle fibers under the skin.  It is treated with rest, ice, compression, and elevation. You may be given over-the-counter medicines for pain.  Contact a health care provider if your symptoms do not improve, or get worse.  Get help right away if you have severe pain, have numbness, or the area turns pale or cold. This information is not intended to  replace advice given to you by your health care provider. Make sure you discuss any questions you have with your health care provider. Document Revised: 06/27/2018 Document Reviewed:  06/27/2018 Elsevier Patient Education  [employer].   If you have lab work done today you will be contacted with your lab results within the next 2 weeks.  If you have not heard from [place] then please contact us. The fastest way to get your results is to register for My Chart.   IF you received an x-ray today, you will receive an invoice from [facility]. Please contact [facility] at [phone] with questions or concerns regarding your invoice.   IF you received labwork today, you will receive an invoice from [place]. Please contact [facility] at [phone] with questions or concerns regarding your invoice.   Our billing staff will not be able to assist you with questions regarding bills from these companies.  You will be contacted with the lab results as soon as they are available. The fastest way to get your results is to activate your My Chart account. Instructions are located on the last page of this paperwork. If you have not heard from [place] regarding the results in 2 weeks, please contact this office.         Signed, Meredith Staggers, MD [facility]

## 2020-03-22 NOTE — Patient Instructions (Addendum)
X-rays reassuring.  I suspect you had a contusion of the back, with some soft tissue injury.  Tylenol as needed, occasional ibuprofen if needed, gentle range of motion as tolerated.  If any worsening symptoms, or shortness of breath, be seen right away.  Return to the clinic or go to the nearest emergency room if any of your symptoms worsen or new symptoms occur. Contusion A contusion is a deep bruise. Contusions are the result of a blunt injury to tissues and muscle fibers under the skin. The injury causes bleeding under the skin. The skin overlying the contusion may turn blue, purple, or yellow. Minor injuries will give you a painless contusion, but more severe injuries cause contusions that may stay painful and swollen for a few weeks. Follow these instructions at home: Pay attention to any changes in your symptoms. Let your health care provider know about them. Take these actions to relieve your pain. Managing pain, stiffness, and swelling   Use resting, icing, applying pressure (compression), and raising (elevating) the injured area. This is often called the RICE strategy. ? Rest the injured area. Return to your normal activities as told by your health care provider. Ask your health care provider what activities are safe for you. ? If directed, put ice on the injured area:  Put ice in a plastic bag.  Place a towel between your skin and the bag.  Leave the ice on for 20 minutes, 2-3 times per day. ? If directed, apply light compression to the injured area using an elastic bandage. Make sure the bandage is not wrapped too tightly. Remove and reapply the bandage as directed by your health care provider. ? If possible, raise (elevate) the injured area above the level of your heart while you are sitting or lying down. General instructions  Take over-the-counter and prescription medicines only as told by your health care provider.  Keep all follow-up visits as told by your health care  provider. This is important. Contact a health care provider if:  Your symptoms do not improve after several days of treatment.  Your symptoms get worse.  You have difficulty moving the injured area. Get help right away if:  You have severe pain.  You have numbness in a hand or foot.  Your hand or foot turns pale or cold. Summary  A contusion is a deep bruise.  Contusions are the result of a blunt injury to tissues and muscle fibers under the skin.  It is treated with rest, ice, compression, and elevation. You may be given over-the-counter medicines for pain.  Contact a health care provider if your symptoms do not improve, or get worse.  Get help right away if you have severe pain, have numbness, or the area turns pale or cold. This information is not intended to replace advice given to you by your health care provider. Make sure you discuss any questions you have with your health care provider. Document Revised: 06/27/2018 Document Reviewed: 06/27/2018 Elsevier Patient Education  [employer].   If you have lab work done today you will be contacted with your lab results within the next 2 weeks.  If you have not heard from [place] then please contact us. The fastest way to get your results is to register for My Chart.   IF you received an x-ray today, you will receive an invoice from [facility]. Please contact [facility] at [phone] with questions or concerns regarding your invoice.   IF you received labwork today, you  will receive an invoice from [place]. Please contact [facility] at [phone] with questions or concerns regarding your invoice.   Our billing staff will not be able to assist you with questions regarding bills from these companies.  You will be contacted with the lab results as soon as they are available. The fastest way to get your results is to activate your My Chart account. Instructions are located on the last page of this  paperwork. If you have not heard from [place] regarding the results in 2 weeks, please contact this office.

## 2020-06-10 ENCOUNTER — Other Ambulatory Visit: Payer: Self-pay | Admitting: Family Medicine

## 2020-06-10 DIAGNOSIS — I10 Essential (primary) hypertension: Secondary | ICD-10-CM

## 2020-06-10 NOTE — Telephone Encounter (Signed)
Requested  medications are  due for refill today yes  Requested medications are on the active medication list yes  Last refill 4/26  Last visit 09/2019  Future visit scheduled NO  Notes to clinic Failed protocol of valid visit within 6 months

## 2020-06-14 DIAGNOSIS — Z03818 Encounter for observation for suspected exposure to other biological agents ruled out: Secondary | ICD-10-CM | POA: Diagnosis not present

## 2020-06-14 DIAGNOSIS — Z20822 Contact with and (suspected) exposure to covid-19: Secondary | ICD-10-CM | POA: Diagnosis not present

## 2020-06-15 ENCOUNTER — Other Ambulatory Visit: Payer: Self-pay | Admitting: Family Medicine

## 2020-06-15 DIAGNOSIS — I10 Essential (primary) hypertension: Secondary | ICD-10-CM

## 2020-06-15 NOTE — Telephone Encounter (Signed)
Called pt LVM to call us back did not leave detailed message (pt needs to schedule an appt for further refills )

## 2020-06-15 NOTE — Telephone Encounter (Signed)
Requested medications are due for refill today?  Yes  Requested medications are on active medication list?  Yes  Last Refill:   09/22/2019  # 90 with 2 refills   Future visit scheduled?  No   Notes to Clinic:  Medication failed RX refill protocol due to no labs within past 180 days.  Last labs were performed on 09/22/2019.

## 2020-06-15 NOTE — Telephone Encounter (Signed)
Refilled medication for 30 days with 0 refills. Patient needs an OV for additional refills.

## 2020-06-28 ENCOUNTER — Other Ambulatory Visit: Payer: Self-pay | Admitting: Family Medicine

## 2020-06-28 ENCOUNTER — Telehealth: Payer: Self-pay | Admitting: Family Medicine

## 2020-06-28 ENCOUNTER — Other Ambulatory Visit: Payer: Self-pay

## 2020-06-28 DIAGNOSIS — I10 Essential (primary) hypertension: Secondary | ICD-10-CM

## 2020-06-28 MED ORDER — LISINOPRIL 5 MG PO TABS: 5.0000 mg | ORAL_TABLET | Freq: Every day | ORAL | 0 refills | Status: DC

## 2020-06-28 MED ORDER — LISINOPRIL 10 MG PO TABS: 10.0000 mg | ORAL_TABLET | Freq: Every day | ORAL | 2 refills | Status: DC

## 2020-06-28 NOTE — Telephone Encounter (Signed)
Copied from CRM [id number]. Topic: Quick Communication - Rx Refill/Question >> Jun 28, 2020  1:49 PM [name] wrote: Medication: lisinopril (ZESTRIL) 10 MG tablet, lisinopril (ZESTRIL) 5 MG tablet  Per patient he need Rx today since he is all out   Has the patient contacted their pharmacy? Yes.   (Agent: If no, request that the patient contact the pharmacy for the refill.) (Agent: If yes, when and what did the pharmacy advise?)  Preferred Pharmacy (with phone number or street name): [id number] [address], [name]  Phone:  [phone] Fax:  [phone]     Agent: Please be advised that RX refills may take up to 3 business days. We ask that you follow-up with your pharmacy.

## 2020-06-28 NOTE — Telephone Encounter (Signed)
Requested medication (s) are due for refill today: no  Requested medication (s) are on the active medication list: yes  Last refill:  06/15/2020  Future visit scheduled: yes  Notes to clinic:  which dose is the correct dose    Requested Prescriptions  Pending Prescriptions Disp Refills   lisinopril (ZESTRIL) 5 MG tablet [Pharmacy Med Name: LISINOPRIL 5 MG TABLET] 30 tablet 0    Sig: TAKE 1 TABLET BY MOUTH EVERY DAY      Cardiovascular:  ACE Inhibitors Failed - 06/28/2020  1:45 PM      Failed - Cr in normal range and within 180 days    Creat  Date Value Ref Range Status  03/19/2014 1.18 0.50 - 1.35 mg/dL Final   Creatinine, Ser  Date Value Ref Range Status  09/22/2019 1.12 0.76 - 1.27 mg/dL Final          Failed - K in normal range and within 180 days    Potassium  Date Value Ref Range Status  09/22/2019 4.0 3.5 - 5.2 mmol/L Final          Passed - Patient is not pregnant      Passed - Last BP in normal range    BP Readings from Last 1 Encounters:  03/22/20 126/82          Passed - Valid encounter within last 6 months    Recent Outpatient Visits           3 months ago Back pain, unspecified back location, unspecified back pain laterality, unspecified chronicity   [facility] [place], Asencion Partridge, MD   9 months ago Essential hypertension   [facility] [place] [facility], Asencion Partridge, MD   1 year ago Essential hypertension   [facility] [place] [facility], Asencion Partridge, MD   1 year ago Tongue pain   [facility] [place], Eilleen Kempf, MD   2 years ago Annual physical exam   [facility], Asencion Partridge, MD       Future Appointments             In 2 weeks Shade Flood, MD [facility] [place], [facility]              lisinopril (ZESTRIL) 10 MG tablet [Pharmacy Med Name: LISINOPRIL 10 MG TABLET] 90 tablet 1    Sig: TAKE 1 TABLET BY MOUTH EVERY DAY      Cardiovascular:  ACE Inhibitors Failed - 06/28/2020  1:45 PM      Failed - Cr  in normal range and within 180 days    Creat  Date Value Ref Range Status  03/19/2014 1.18 0.50 - 1.35 mg/dL Final   Creatinine, Ser  Date Value Ref Range Status  09/22/2019 1.12 0.76 - 1.27 mg/dL Final          Failed - K in normal range and within 180 days    Potassium  Date Value Ref Range Status  09/22/2019 4.0 3.5 - 5.2 mmol/L Final          Passed - Patient is not pregnant      Passed - Last BP in normal range    BP Readings from Last 1 Encounters:  03/22/20 126/82          Passed - Valid encounter within last 6 months    Recent Outpatient Visits           3 months ago Back pain, unspecified  back location, unspecified back pain laterality, unspecified chronicity   [facility], Asencion Partridge, MD   9 months ago Essential hypertension   [facility], Asencion Partridge, MD   1 year ago Essential hypertension   [facility] [place] [facility], Asencion Partridge, MD   1 year ago Tongue pain   [facility], Eilleen Kempf, MD   2 years ago Annual physical exam   [facility] [place], Asencion Partridge, MD       Future Appointments             In 2 weeks Neva Seat Asencion Partridge, MD [facility] [place], [facility]

## 2020-06-28 NOTE — Telephone Encounter (Signed)
Duplicate message. 

## 2020-07-14 ENCOUNTER — Encounter: Payer: Self-pay | Admitting: Family Medicine

## 2020-07-14 ENCOUNTER — Ambulatory Visit: Payer: BC Managed Care – PPO | Admitting: Family Medicine

## 2020-07-14 ENCOUNTER — Other Ambulatory Visit: Payer: Self-pay

## 2020-07-14 VITALS — BP 136/94 | HR 92 | Temp 98.4°F | Ht 72.0 in | Wt 220.0 lb

## 2020-07-14 DIAGNOSIS — I10 Essential (primary) hypertension: Secondary | ICD-10-CM

## 2020-07-14 DIAGNOSIS — I493 Ventricular premature depolarization: Secondary | ICD-10-CM

## 2020-07-14 DIAGNOSIS — Z1159 Encounter for screening for other viral diseases: Secondary | ICD-10-CM | POA: Diagnosis not present

## 2020-07-14 DIAGNOSIS — E785 Hyperlipidemia, unspecified: Secondary | ICD-10-CM

## 2020-07-14 DIAGNOSIS — Z23 Encounter for immunization: Secondary | ICD-10-CM | POA: Diagnosis not present

## 2020-07-14 MED ORDER — LISINOPRIL 5 MG PO TABS: 5.0000 mg | ORAL_TABLET | Freq: Every day | ORAL | 2 refills | Status: DC

## 2020-07-14 MED ORDER — LISINOPRIL 10 MG PO TABS: 10.0000 mg | ORAL_TABLET | Freq: Every day | ORAL | 2 refills | Status: DC

## 2020-07-14 NOTE — Progress Notes (Signed)
Subjective:  Patient ID: Jim Fisher, male    DOB: 26-May-1976  Age: 44 y.o. MRN: [id number]  CC:  Chief Complaint  Patient presents with  . Follow-up    on hypertension. Pt reports no issues with his BP since last OV. other than a headache. pt states he hasn't been able to get his lisinopril medication pt currently takes a total of 15 mg per day one scriped of 10mg  and another for 5mg . pt states he one will get refilled and not the other. pt has curently ben taking only the 10mg  lisinopril daily after taking all his 5 mg lisinopril because he didn't have the 10mg  tab.     HPI Jim Fisher presents for   Hypertension: Hypertension last discussed in November 2020.  Added 5 mg of lisinopril at that time for improved control for total dose of 15 mg.  Readings were stable at office visit in May for unrelated issue.  Had some issues with refills - either 5 or 10 was refilled, not both. Has been taking 10mg  past 2 weeks. HA last weekend and few days ago.  BP 150/90 last week.   Wt Readings from Last 3 Encounters:  07/14/20 220 lb (99.8 kg)  03/22/20 223 lb (101.2 kg)  03/08/20 220 lb (99.8 kg)   BP Readings from Last 3 Encounters:  07/14/20 (!) 136/94  03/22/20 126/82  03/08/20 140/82   Lab Results  Component Value Date   CREATININE 1.12 09/22/2019   Hyperlipidemia: Elevated previously, but low ASCVD risk score, not on statin.  Plan for weight loss. Father with MI in 22's, and had stent recently.  NOT fasting today.   The 10-year ASCVD risk score 07/16/20 DC 05/22/20., et al., 2013) is: 6.6%   Values used to calculate the score:     Age: 50 years     Sex: Male     Is Non-Hispanic African American: Yes     Diabetic: No     Tobacco smoker: No     Systolic Blood Pressure: 136 mmHg     Is BP treated: Yes     HDL Cholesterol: 54 mg/dL     Total Cholesterol: 228 mg/dL  Lab Results  Component Value Date   CHOL 228 (H) 09/22/2019   HDL 54 09/22/2019   LDLCALC 144 (H) 09/22/2019     TRIG 169 (H) 09/22/2019   CHOLHDL 4.2 09/22/2019   Lab Results  Component Value Date   ALT 19 09/22/2019   AST 21 09/22/2019   ALKPHOS 41 09/22/2019   BILITOT <0.2 09/22/2019   Immunization History  Administered Date(s) Administered  . Influenza Split 11/25/2012  . Influenza,inj,Quad PF,6+ Mos 09/22/2019  . Tdap 07/28/2011  had Covid vaccine in March.    History Patient Active Problem List   Diagnosis Date Noted  . Tongue pain 08/16/2018  . Cough 05/02/2017  . Acute bronchitis 05/02/2017  . PVC (premature ventricular contraction) 09/22/2016  . 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 Anxiety   . Epilepsy (HCC)   . Hypertension   . Palpitations   . Seizures (HCC)    Past Surgical History:  Procedure Laterality Date  . SHOULDER ARTHROSCOPY W/ ACROMIAL REPAIR  2008   No Known Allergies Prior to Admission medications   Medication Sig Start Date End Date Taking? Authorizing Provider  levETIRAcetam (KEPPRA) 750 MG tablet Take 1 tablet (750 mg total) by mouth 2 (two) times daily. 03/08/20  Yes Glean SalvoSlack, Sarah J, NP  lisinopril (ZESTRIL) 10 MG tablet TAKE 1 TABLET BY MOUTH EVERY DAY 06/28/20  Yes Shade FloodGreene, Mattea Seger R, MD  lisinopril (ZESTRIL) 5 MG tablet TAKE 1 TABLET BY MOUTH EVERY DAY 06/28/20  Yes Shade FloodGreene, Emmalynn Pinkham R, MD   Social History   Socioeconomic History  . Marital status: Married    Spouse name: Diane   . Number of children: 1  . Years of education: Not on file  . Highest education level: Not on file  Occupational History  . Occupation: Investment banker, corporateALES    Employer: [employer]  Tobacco Use  . Smoking status: Former Smoker    Years: 2.00    Types: Cigarettes    Quit date: 11/20/1996    Years since quitting: 23.6  . Smokeless tobacco: Never Used  . Tobacco comment: SMOKED IN HIGH SCHOOL AT LEAST 3 CIGARETTES PER DAY  Substance and Sexual Activity  . Alcohol use: No    Alcohol/week: 1.0 standard drink    Types: 1 Cans of  beer per week    Comment: every 2 weeks   . Drug use: No  . Sexual activity: Yes    Comment: number of sex partners in the last 12 months 1  Other Topics Concern  . Not on file  Social History Narrative   Exercise doing weights and cardio 5 x/week for 1 hour.    Patient works at [employer].   Patient has some college.   Patient is married with one child.    Social Determinants of Health   Financial Resource Strain:   . Difficulty of Paying Living Expenses: Not on file  Food Insecurity:   . Worried About Programme researcher, broadcasting/film/videounning Out of Food in the Last Year: Not on file  . Ran Out of Food in the Last Year: Not on file  Transportation Needs:   . Lack of Transportation (Medical): Not on file  . Lack of Transportation (Non-Medical): Not on file  Physical Activity:   . Days of Exercise per Week: Not on file  . Minutes of Exercise per Session: Not on file  Stress:   . Feeling of Stress : Not on file  Social Connections:   . Frequency of Communication with Friends and Family: Not on file  . Frequency of Social Gatherings with Friends and Family: Not on file  . Attends Religious Services: Not on file  . Active Member of Clubs or Organizations: Not on file  . Attends BankerClub or Organization Meetings: Not on file  . Marital Status: Not on file  Intimate Partner Violence:   . Fear of Current or Ex-Partner: Not on file  . Emotionally Abused: Not on file  . Physically Abused: Not on file  . Sexually Abused: Not on file    Review of Systems  Constitutional: Negative for fatigue and unexpected weight change.  Eyes: Negative for visual disturbance.  Respiratory: Negative for cough, chest tightness and shortness of breath.   Cardiovascular: Positive for palpitations (occasional PVC, better with otc magnesium, meditation. ). Negative for chest pain and leg swelling.  Gastrointestinal: Negative for abdominal pain and blood in stool.  Neurological: Positive for headaches (as above. ). Negative for dizziness and  light-headedness.     Objective:   Vitals:   07/14/20 0859 07/14/20 0905  BP: (!) 149/100 (!) 136/94  Pulse: 92   Temp: 98.4 F (36.9 C)   TempSrc: Temporal   SpO2: 96%   Weight: 220 lb (99.8 kg)   Height: 6' (1.829 m)  Physical Exam Vitals reviewed.  Constitutional:      Appearance: He is well-developed.  HENT:     Head: Normocephalic and atraumatic.  Eyes:     Pupils: Pupils are equal, round, and reactive to light.  Neck:     Vascular: No carotid bruit or JVD.  Cardiovascular:     Rate and Rhythm: Normal rate and regular rhythm.     Heart sounds: Normal heart sounds. No murmur heard.   Pulmonary:     Effort: Pulmonary effort is normal.     Breath sounds: Normal breath sounds. No rales.  Skin:    General: Skin is warm and dry.  Neurological:     Mental Status: He is alert and oriented to person, place, and time.        Assessment & Plan:  Jim Fisher is a 44 y.o. male . Essential hypertension - Plan: lisinopril (ZESTRIL) 5 MG tablet, lisinopril (ZESTRIL) 10 MG tablet  -Decreased control on partial med regimen. Restart at 50 mg total per day, nurse visit in 2 weeks. Check labs.  Need for hepatitis C screening test - Plan: Hepatitis C antibody  Need for prophylactic vaccination and inoculation against influenza - Plan: Flu Vaccine QUAD 36+ mos IM  PVC's (premature ventricular contractions) - Plan: TSH  -Intermittent, controlled with magnesium and meditation. Check TSH. Family history of thyroid disease.  Hyperlipidemia, unspecified hyperlipidemia type - Plan: Comprehensive metabolic panel, Lipid panel  -Previous ASCVD risk: Not at level of statin but with family history of cardiac disease and other, would consider low-dose statin depending on his lipids. Check today, may need fasting lab visit if elevated. Potential side effects and risk of statins were discussed if we do start one.   Meds ordered this encounter  Medications  . lisinopril  (ZESTRIL) 5 MG tablet    Sig: Take 1 tablet (5 mg total) by mouth daily.    Dispense:  90 tablet    Refill:  2    DX Code Needed  .  . lisinopril (ZESTRIL) 10 MG tablet    Sig: Take 1 tablet (10 mg total) by mouth daily.    Dispense:  90 tablet    Refill:  2   Patient Instructions    I would consider a low dose statin if cholesterol is still elevated due to your father's heart history. However since you're not fasting today, if that cholesterol is elevated I can place a lab only order for that to be repeated fasting.  Restart lisinopril at a total of 15 mg/day, nurse only visit in the next 2 weeks to recheck blood pressure on that medication. If you do purchase a blood pressure monitor, bring that with you to the nurse visit to check accuracy.  106-month follow-up for physical. Let me know if there are questions and take care.    If you have lab work done today you will be contacted with your lab results within the next 2 weeks.  If you have not heard from [place] then please contact us. The fastest way to get your results is to register for My Chart.   IF you received an x-ray today, you will receive an invoice from [facility]. Please contact [facility] at [phone] with questions or concerns regarding your invoice.   IF you received labwork today, you will receive an invoice from [place]. Please contact [facility] at [phone] with questions or concerns regarding your invoice.   Our billing staff will not be able to  assist you with questions regarding bills from these companies.  You will be contacted with the lab results as soon as they are available. The fastest way to get your results is to activate your My Chart account. Instructions are located on the last page of this paperwork. If you have not heard from [place] regarding the results in 2 weeks, please contact this office.         Signed, Meredith Staggers, MD [facility]

## 2020-07-14 NOTE — Patient Instructions (Addendum)
  I would consider a low dose statin if cholesterol is still elevated due to your father's heart history. However since you're not fasting today, if that cholesterol is elevated I can place a lab only order for that to be repeated fasting.  Restart lisinopril at a total of 15 mg/day, nurse only visit in the next 2 weeks to recheck blood pressure on that medication. If you do purchase a blood pressure monitor, bring that with you to the nurse visit to check accuracy.  71-month follow-up for physical. Let me know if there are questions and take care.    If you have lab work done today you will be contacted with your lab results within the next 2 weeks.  If you have not heard from [place] then please contact us. The fastest way to get your results is to register for My Chart.   IF you received an x-ray today, you will receive an invoice from [facility]. Please contact [facility] at [phone] with questions or concerns regarding your invoice.   IF you received labwork today, you will receive an invoice from [place]. Please contact [facility] at [phone] with questions or concerns regarding your invoice.   Our billing staff will not be able to assist you with questions regarding bills from these companies.  You will be contacted with the lab results as soon as they are available. The fastest way to get your results is to activate your My Chart account. Instructions are located on the last page of this paperwork. If you have not heard from [place] regarding the results in 2 weeks, please contact this office.

## 2020-07-15 LAB — COMPREHENSIVE METABOLIC PANEL
ALT: 31 IU/L (ref 0–44)
AST: 19 IU/L (ref 0–40)
Albumin/Globulin Ratio: 1.6 (ref 1.2–2.2)
Albumin: 4.5 g/dL (ref 4.0–5.0)
Alkaline Phosphatase: 40 IU/L — ABNORMAL LOW (ref 48–121)
BUN/Creatinine Ratio: 14 (ref 9–20)
BUN: 15 mg/dL (ref 6–24)
Bilirubin Total: 0.3 mg/dL (ref 0.0–1.2)
CO2: 21 mmol/L (ref 20–29)
Calcium: 9.9 mg/dL (ref 8.7–10.2)
Chloride: 101 mmol/L (ref 96–106)
Creatinine, Ser: 1.05 mg/dL (ref 0.76–1.27)
GFR calc Af Amer: 101 mL/min/{1.73_m2} (ref >59)
GFR calc non Af Amer: 87 mL/min/{1.73_m2} (ref >59)
Globulin, Total: 2.8 g/dL (ref 1.5–4.5)
Glucose: 101 mg/dL — ABNORMAL HIGH (ref 65–99)
Potassium: 4.7 mmol/L (ref 3.5–5.2)
Sodium: 136 mmol/L (ref 134–144)
Total Protein: 7.3 g/dL (ref 6.0–8.5)

## 2020-07-15 LAB — HEPATITIS C ANTIBODY: Hep C Virus Ab: 0.2 s/co ratio (ref 0.0–0.9)

## 2020-07-15 LAB — LIPID PANEL
Chol/HDL Ratio: 5.1 ratio — ABNORMAL HIGH (ref 0.0–5.0)
Cholesterol, Total: 258 mg/dL — ABNORMAL HIGH (ref 100–199)
HDL: 51 mg/dL (ref >39)
LDL Chol Calc (NIH): 175 mg/dL — ABNORMAL HIGH (ref 0–99)
Triglycerides: 173 mg/dL — ABNORMAL HIGH (ref 0–149)
VLDL Cholesterol Cal: 32 mg/dL (ref 5–40)

## 2020-07-15 LAB — TSH: TSH: 1.2 u[IU]/mL (ref 0.450–4.500)

## 2020-07-27 ENCOUNTER — Other Ambulatory Visit: Payer: Self-pay | Admitting: Family Medicine

## 2020-07-27 DIAGNOSIS — E785 Hyperlipidemia, unspecified: Secondary | ICD-10-CM

## 2020-07-28 ENCOUNTER — Other Ambulatory Visit: Payer: Self-pay

## 2020-07-28 ENCOUNTER — Ambulatory Visit (INDEPENDENT_AMBULATORY_CARE_PROVIDER_SITE_OTHER): Payer: BC Managed Care – PPO | Admitting: Family Medicine

## 2020-07-28 DIAGNOSIS — E785 Hyperlipidemia, unspecified: Secondary | ICD-10-CM | POA: Diagnosis not present

## 2020-07-28 NOTE — Progress Notes (Signed)
Patient came in today for a BP check 139/87 as well as repeat labs per drs orders.

## 2020-07-29 LAB — LIPID PANEL
Chol/HDL Ratio: 4.2 ratio (ref 0.0–5.0)
Cholesterol, Total: 191 mg/dL (ref 100–199)
HDL: 46 mg/dL (ref >39)
LDL Chol Calc (NIH): 126 mg/dL — ABNORMAL HIGH (ref 0–99)
Triglycerides: 103 mg/dL (ref 0–149)
VLDL Cholesterol Cal: 19 mg/dL (ref 5–40)

## 2020-08-23 DIAGNOSIS — M9901 Segmental and somatic dysfunction of cervical region: Secondary | ICD-10-CM | POA: Diagnosis not present

## 2020-08-23 DIAGNOSIS — R519 Headache, unspecified: Secondary | ICD-10-CM | POA: Diagnosis not present

## 2020-08-23 DIAGNOSIS — M5417 Radiculopathy, lumbosacral region: Secondary | ICD-10-CM | POA: Diagnosis not present

## 2020-08-23 DIAGNOSIS — M9902 Segmental and somatic dysfunction of thoracic region: Secondary | ICD-10-CM | POA: Diagnosis not present

## 2020-09-07 ENCOUNTER — Encounter: Payer: Self-pay | Admitting: Neurology

## 2020-09-07 ENCOUNTER — Ambulatory Visit: Payer: BC Managed Care – PPO | Admitting: Neurology

## 2020-09-07 VITALS — BP 135/82 | HR 71 | Ht 72.0 in | Wt 210.4 lb

## 2020-09-07 DIAGNOSIS — G40309 Generalized idiopathic epilepsy and epileptic syndromes, not intractable, without status epilepticus: Secondary | ICD-10-CM

## 2020-09-07 MED ORDER — LEVETIRACETAM 750 MG PO TABS: 750.0000 mg | ORAL_TABLET | Freq: Two times a day (BID) | ORAL | 4 refills | Status: DC

## 2020-09-07 NOTE — Patient Instructions (Signed)
Continue Keppra, take everyday, don't miss any doses  Call for seizure activity  See you back in 1 year or sooner if needed

## 2020-09-07 NOTE — Progress Notes (Signed)
PATIENT: Jim Fisher DOB: 10-16-1976  REASON FOR VISIT: follow up HISTORY FROM: patient  HISTORY OF PRESENT ILLNESS: Today 09/07/20 Jim Fisher is a 44 year old male with history of seizure disorder.  Last seizure was in April 2021 (tried to decrease dose of Keppra to 500 mg twice daily form 750 mg twice daily since seizure free for 10 years), grand mal seizure, the day before he missed 2 doses of Keppra, on Keppra 500 mg twice a day.  After recent seizure, dose was increased back to 750 mg twice a day.  He continues to do well, tolerating Keppra.  He works at [employer].  Presents today for evaluation unaccompanied.  He has just resumed driving.  HISTORY 03/08/2020 SS: Jim Fisher is a 44 year old male with history of seizure disorder.  When last seen, his dose of Keppra was decreased to 500 mg twice a day, as it had been about 10 years since his last seizure.  Unfortunately, he suffered a grand mal seizure 03/04/2020.  However, the day before, he missed 2 doses of Keppra.  He remembers on the morning of the seizure, feeling the aura, his wife was able to assist him to the ground.  It lasted about 5 minutes, afterwards he was confused, somewhat combative, wanting to get up.  EMS came and, by the time they arrived, he was coming back around.  He was not transported to the hospital.  He did bite his tongue, no urinary incontinence.  He did not hit his head. He has been having some muscle tightness to his left should blade. For the last 1-2 years having occasional palpations.  He works at [employer].  He presents today for evaluation accompanied by his wife.   REVIEW OF SYSTEMS: Out of a complete 14 system review of symptoms, the patient complains only of the following symptoms, and all other reviewed systems are negative.  Seizure  ALLERGIES: No Known Allergies  HOME MEDICATIONS: Outpatient Medications Prior to Visit  Medication Sig Dispense Refill  . lisinopril (ZESTRIL) 10 MG tablet Take 1  tablet (10 mg total) by mouth daily. 90 tablet 2  . lisinopril (ZESTRIL) 5 MG tablet Take 1 tablet (5 mg total) by mouth daily. 90 tablet 2  . levETIRAcetam (KEPPRA) 750 MG tablet Take 1 tablet (750 mg total) by mouth 2 (two) times daily. 180 tablet 4   No facility-administered medications prior to visit.    PAST MEDICAL HISTORY: Past Medical History:  Diagnosis Date  . Anxiety   . Epilepsy (HCC)   . Hypertension   . Palpitations   . Seizures (HCC)     PAST SURGICAL HISTORY: Past Surgical History:  Procedure Laterality Date  . SHOULDER ARTHROSCOPY W/ ACROMIAL REPAIR  2008    FAMILY HISTORY: Family History  Problem Relation Age of Onset  . Hypertension Maternal Grandmother   . Diabetes Maternal Grandmother     SOCIAL HISTORY: Social History   Socioeconomic History  . Marital status: Married    Spouse name: Diane   . Number of children: 1  . Years of education: Not on file  . Highest education level: Not on file  Occupational History  . Occupation: Investment banker, corporate: [employer]  Tobacco Use  . Smoking status: Former Smoker    Years: 2.00    Types: Cigarettes    Quit date: 11/20/1996    Years since quitting: 23.8  . Smokeless tobacco: Never Used  . Tobacco comment: SMOKED IN HIGH SCHOOL AT LEAST  3 CIGARETTES PER DAY  Substance and Sexual Activity  . Alcohol use: No    Alcohol/week: 1.0 standard drink    Types: 1 Cans of beer per week    Comment: every 2 weeks   . Drug use: No  . Sexual activity: Yes    Comment: number of sex partners in the last 12 months 1  Other Topics Concern  . Not on file  Social History Narrative   Exercise doing weights and cardio 5 x/week for 1 hour.    Patient works at [employer].   Patient has some college.   Patient is married with one child.    Social Determinants of Health   Financial Resource Strain:   . Difficulty of Paying Living Expenses: Not on file  Food Insecurity:   . Worried About Programme researcher, broadcasting/film/video in the Last Year:  Not on file  . Ran Out of Food in the Last Year: Not on file  Transportation Needs:   . Lack of Transportation (Medical): Not on file  . Lack of Transportation (Non-Medical): Not on file  Physical Activity:   . Days of Exercise per Week: Not on file  . Minutes of Exercise per Session: Not on file  Stress:   . Feeling of Stress : Not on file  Social Connections:   . Frequency of Communication with Friends and Family: Not on file  . Frequency of Social Gatherings with Friends and Family: Not on file  . Attends Religious Services: Not on file  . Active Member of Clubs or Organizations: Not on file  . Attends Banker Meetings: Not on file  . Marital Status: Not on file  Intimate Partner Violence:   . Fear of Current or Ex-Partner: Not on file  . Emotionally Abused: Not on file  . Physically Abused: Not on file  . Sexually Abused: Not on file   PHYSICAL EXAM  Vitals:   09/07/20 1035  BP: 135/82  Pulse: 71  Weight: 210 lb 6.4 oz (95.4 kg)  Height: 6' (1.829 m)   Body mass index is 28.54 kg/m.  Generalized: Well developed, in no acute distress  Neurological examination  Mentation: Alert oriented to time, place, history taking. Follows all commands speech and language fluent Cranial nerve II-XII: Pupils were equal round reactive to light. Extraocular movements were full, visual field were full on confrontational test. Facial sensation and strength were normal. Head turning and shoulder shrug  were normal and symmetric. Motor: The motor testing reveals 5 over 5 strength of all 4 extremities. Good symmetric motor tone is noted throughout.  Sensory: Sensory testing is intact to soft touch on all 4 extremities. No evidence of extinction is noted.  Coordination: Cerebellar testing reveals good finger-nose-finger and heel-to-shin bilaterally.  Gait and station: Gait is normal.  Reflexes: Deep tendon reflexes are symmetric and normal bilaterally.   DIAGNOSTIC DATA (LABS,  IMAGING, TESTING) - I reviewed patient records, labs, notes, testing and imaging myself where available.  Lab Results  Component Value Date   WBC 8.2 12/24/2018   HGB 14.0 12/24/2018   HCT 42.1 12/24/2018   MCV 80 12/24/2018   PLT 288 12/24/2018      Component Value Date/Time   NA 136 07/14/2020 0959   K 4.7 07/14/2020 0959   CL 101 07/14/2020 0959   CO2 21 07/14/2020 0959   GLUCOSE 101 (H) 07/14/2020 0959   GLUCOSE 104 (H) 09/18/2016 1510   BUN 15 07/14/2020 0959   CREATININE 1.05  07/14/2020 0959   CREATININE 1.18 03/19/2014 1906   CALCIUM 9.9 07/14/2020 0959   PROT 7.3 07/14/2020 0959   ALBUMIN 4.5 07/14/2020 0959   AST 19 07/14/2020 0959   ALT 31 07/14/2020 0959   ALKPHOS 40 (L) 07/14/2020 0959   BILITOT 0.3 07/14/2020 0959   GFRNONAA 87 07/14/2020 0959   GFRAA 101 07/14/2020 0959   Lab Results  Component Value Date   CHOL 191 07/28/2020   HDL 46 07/28/2020   LDLCALC 126 (H) 07/28/2020   TRIG 103 07/28/2020   CHOLHDL 4.2 07/28/2020   No results found for: HGBA1C No results found for: VITAMINB12 Lab Results  Component Value Date   TSH 1.200 07/14/2020    ASSESSMENT AND PLAN 44 y.o. year old male  has a past medical history of Anxiety, Epilepsy (HCC), Hypertension, Palpitations, and Seizures (HCC). here with:  1.  History of seizures, recent seizure 03/04/2020 -Continue Keppra 750 mg twice daily, refill sent -Call for seizure activity, otherwise follow-up 1 year or sooner if needed  I spent 20 minutes of face-to-face and non-face-to-face time with patient.  This included previsit chart review, lab review, study review, order entry, electronic health record documentation, patient education.  Margie Ege, AGNP-C, DNP 09/07/2020, 11:43 AM [facility] [address] [place], [place] [address] [phone]

## 2020-09-09 NOTE — Progress Notes (Signed)
I have read the note, and I agree with the clinical assessment and plan.  Carsten Carstarphen K Jaquil Todt   

## 2020-12-08 IMAGING — DX DG THORACIC SPINE 2V
3 series · 3 of 3 positions shown · non-contrast
Comparison: None.

CLINICAL DATA: Back pain after fall.

EXAM:
THORACIC SPINE 2 VIEWS

[t-spine ap]
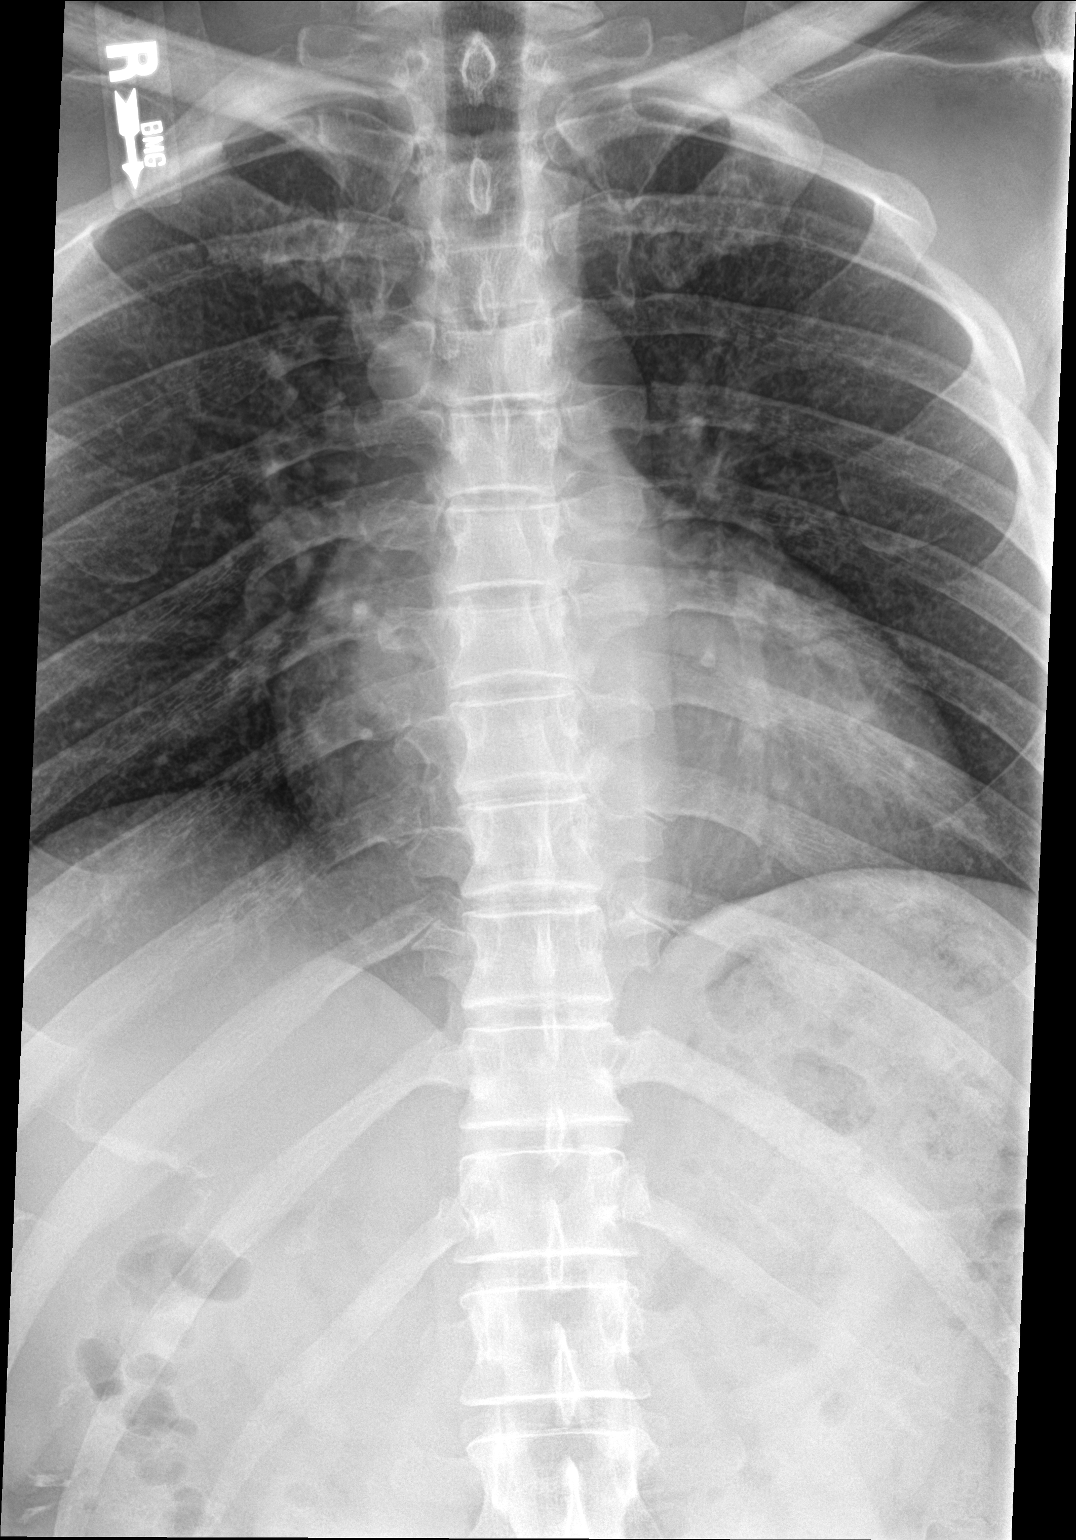

[t-spine lat (1 of 2)]
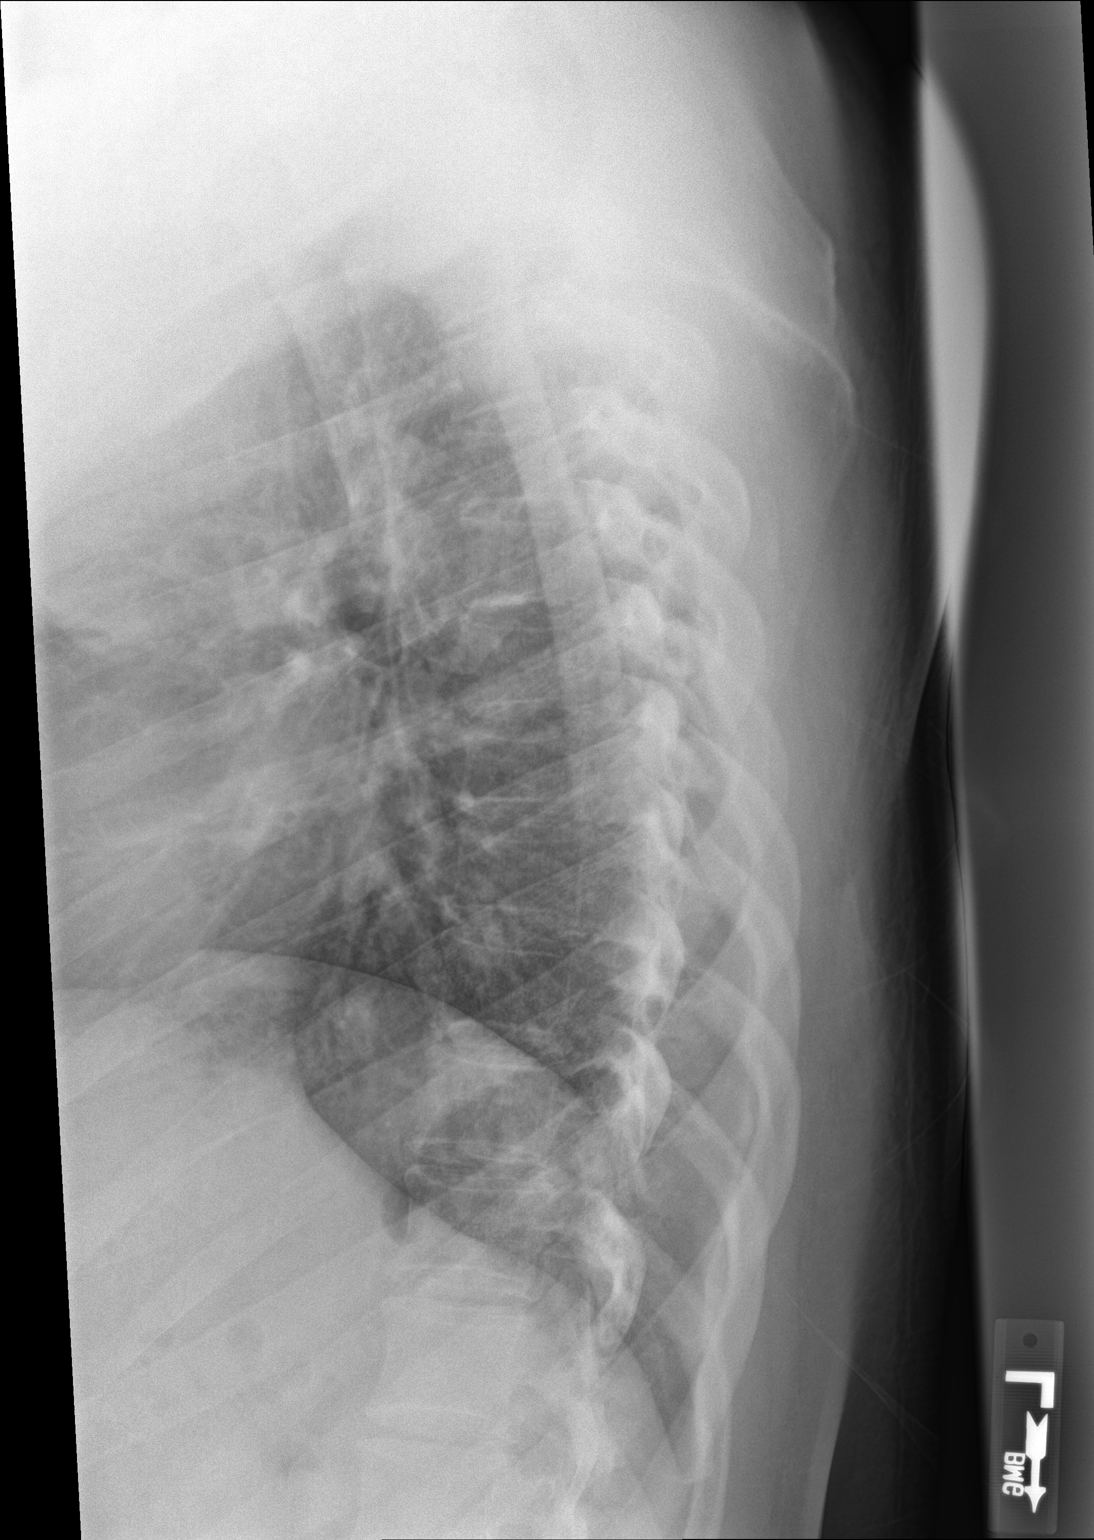

[t-spine lat (2 of 2)]
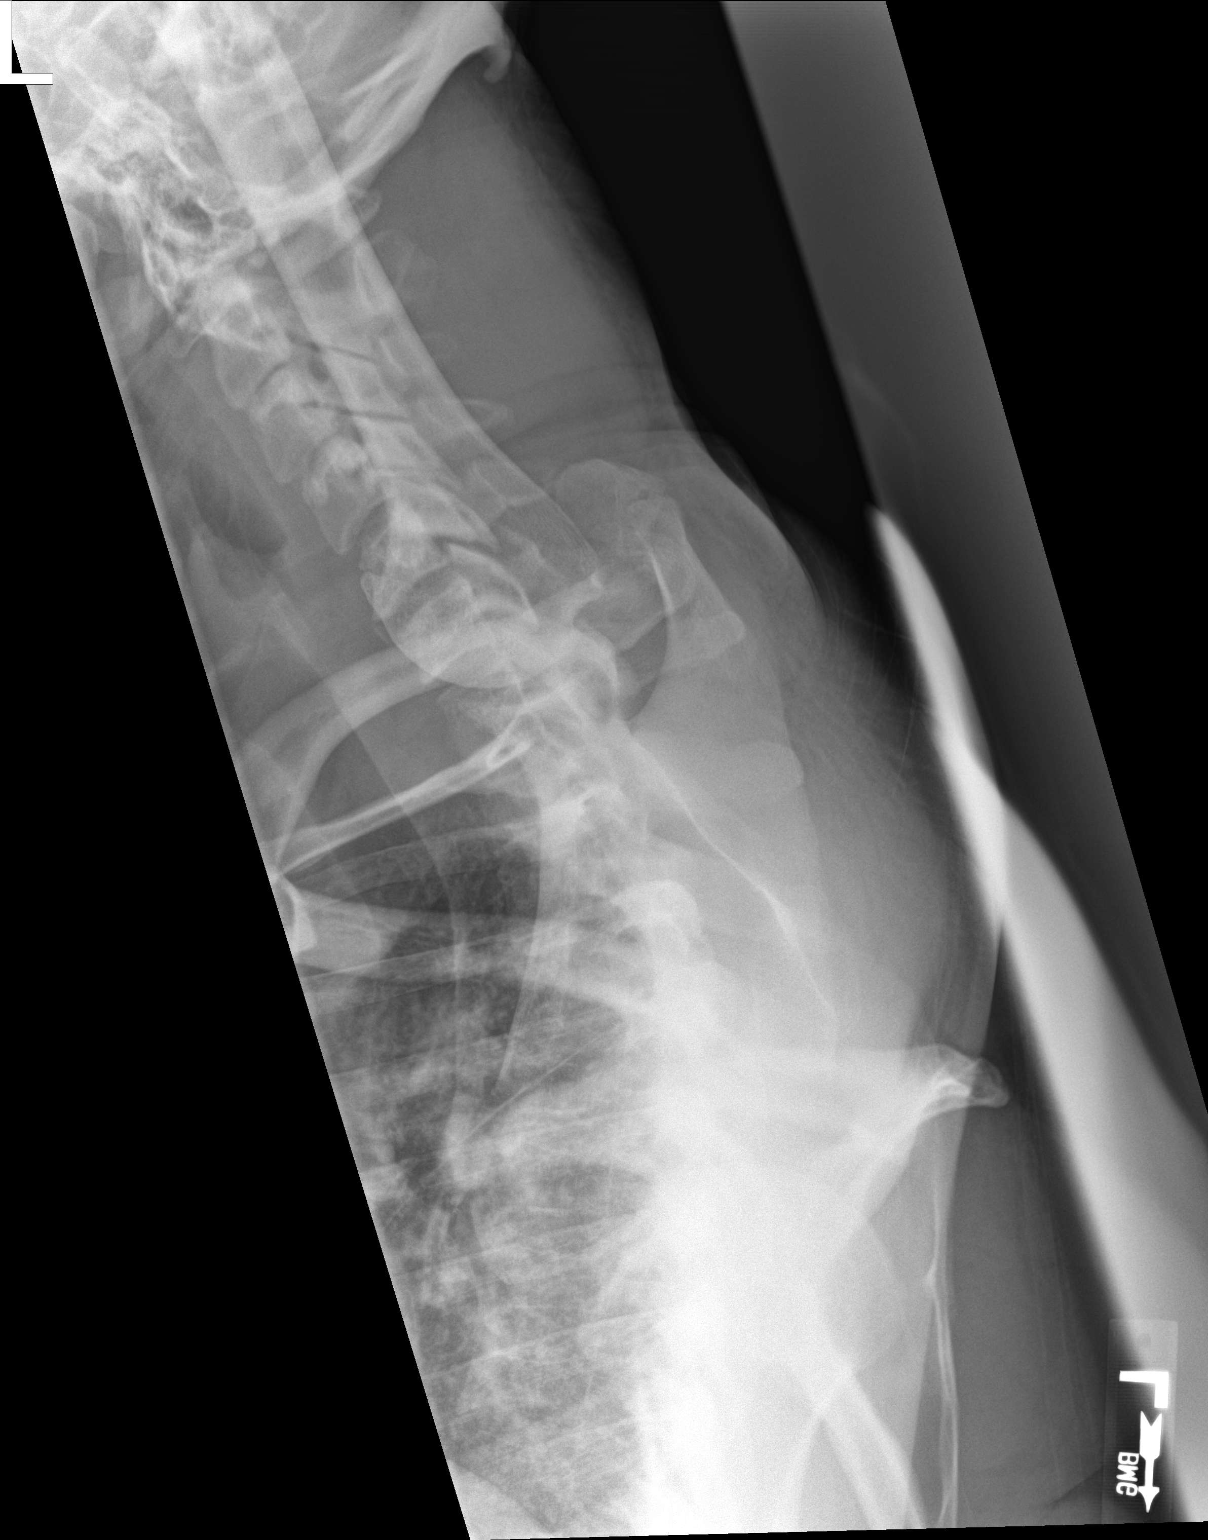

[3 of 3 positions shown; findings below may reference images not displayed]

FINDINGS: There is no evidence of thoracic spine fracture. Alignment is
normal. No other significant bone abnormalities are identified.
IMPRESSION: Negative.

## 2021-01-14 ENCOUNTER — Encounter: Payer: Self-pay | Admitting: Family Medicine

## 2021-01-14 ENCOUNTER — Other Ambulatory Visit: Payer: Self-pay

## 2021-01-14 ENCOUNTER — Ambulatory Visit (INDEPENDENT_AMBULATORY_CARE_PROVIDER_SITE_OTHER): Payer: BC Managed Care – PPO | Admitting: Family Medicine

## 2021-01-14 VITALS — BP 122/74 | HR 97 | Temp 98.4°F | Resp 17 | Ht 72.0 in | Wt 223.0 lb

## 2021-01-14 DIAGNOSIS — Z130 Encounter for screening for diseases of the blood and blood-forming organs and certain disorders involving the immune mechanism: Secondary | ICD-10-CM

## 2021-01-14 DIAGNOSIS — I10 Essential (primary) hypertension: Secondary | ICD-10-CM | POA: Insufficient documentation

## 2021-01-14 DIAGNOSIS — Z0001 Encounter for general adult medical examination with abnormal findings: Secondary | ICD-10-CM | POA: Diagnosis not present

## 2021-01-14 DIAGNOSIS — Z0000 Encounter for general adult medical examination without abnormal findings: Secondary | ICD-10-CM

## 2021-01-14 DIAGNOSIS — Z131 Encounter for screening for diabetes mellitus: Secondary | ICD-10-CM | POA: Diagnosis not present

## 2021-01-14 DIAGNOSIS — E785 Hyperlipidemia, unspecified: Secondary | ICD-10-CM

## 2021-01-14 DIAGNOSIS — Z808 Family history of malignant neoplasm of other organs or systems: Secondary | ICD-10-CM | POA: Diagnosis not present

## 2021-01-14 MED ORDER — LISINOPRIL 5 MG PO TABS: 5.0000 mg | ORAL_TABLET | Freq: Every day | ORAL | 2 refills | Status: DC

## 2021-01-14 MED ORDER — LISINOPRIL 10 MG PO TABS: 10.0000 mg | ORAL_TABLET | Freq: Every day | ORAL | 2 refills | Status: DC

## 2021-01-14 NOTE — Progress Notes (Signed)
Subjective:  Patient ID: Jim Fisher, male    DOB: Aug 04, 1976  Age: 45 y.o. MRN: [id number]  CC:  Chief Complaint  Patient presents with  . Annual Exam    Pt doing well no concerns     HPI [name] presents for  Annual physical exam  Hypertension: Previously had increased dose of lisinopril to 50 mg, some difficulty with refills last August and was only taking 10 mg at that time.  Stable at neuro visit October 19, 135/82 Stress on way here. Still on $Remove'15mg'qfJYGuF$  lisinopril.  Seen by cardiology in the past for PVCs.  Decreased with lessening caffeine intake. Those have stopped at this point.  Home readings: 120-138/75-82 BP Readings from Last 3 Encounters:  01/14/21 (!) 147/82  09/07/20 135/82  07/28/20 139/87   Lab Results  Component Value Date   CREATININE 1.05 07/14/2020   Seizure disorder Seizure in April 2021.  Continues on Keppra 750 mg twice daily. Followed by neuro with appt in October.   Hyperlipidemia: ASCVD risk score 6.8 in September.  Readings had improved at that time.  Has had some weight gain since October. Less diet adherence past month. Starting new business with reptile sales. New puppy at home - mastiff-pitbull.  More fast food. Did have some weight gain with exercise as well.  Wt Readings from Last 3 Encounters:  01/14/21 223 lb (101.2 kg)  09/07/20 210 lb 6.4 oz (95.4 kg)  07/28/20 214 lb (97.1 kg)    Lab Results  Component Value Date   CHOL 191 07/28/2020   HDL 46 07/28/2020   LDLCALC 126 (H) 07/28/2020   TRIG 103 07/28/2020   CHOLHDL 4.2 07/28/2020   Lab Results  Component Value Date   ALT 31 07/14/2020   AST 19 07/14/2020   ALKPHOS 40 (L) 07/14/2020   BILITOT 0.3 07/14/2020   Cancer screening: Grandmother with multiple myeloma Father with lung cancer? Smoker.  1/2 sister with unknown cancer - thyroid?  The natural history of prostate cancer and ongoing controversy regarding screening and potential treatment outcomes of prostate  cancer has been discussed with the patient. The meaning of a false positive PSA and a false negative PSA has been discussed. He indicates understanding of the limitations of this screening test and wishes NOT to proceed with screening PSA testing.  Rare cigars, no other smoking.  Alcohol - 4 drinks per week.   Immunization History  Administered Date(s) Administered  . Influenza Split 11/25/2012  . Influenza,inj,Quad PF,6+ Mos 09/22/2019, 07/14/2020  . PFIZER(Purple Top)SARS-COV-2 Vaccination 01/22/2020, 02/19/2020, 10/20/2020  . Tdap 07/28/2011   Depression screen [facility] 2/9 01/14/2021 03/22/2020 09/22/2019 12/24/2018 08/16/2018  Decreased Interest 0 0 0 0 0  Down, Depressed, Hopeless 0 0 0 0 0  PHQ - 2 Score 0 0 0 0 0  Altered sleeping - - - 0 -  Tired, decreased energy - - - 0 -  Change in appetite - - - 0 -  Feeling bad or failure about yourself  - - - 0 -  Trouble concentrating - - - 0 -  Moving slowly or fidgety/restless - - - 0 -  Suicidal thoughts - - - 0 -  PHQ-9 Score - - - 0 -  Difficult doing work/chores - - - Not difficult at all -    Hearing Screening   '125Hz'$  $Remo'250Hz'qmEBF$'500Hz'$'1000Hz'$'2000Hz'$'3000Hz'$'4000Hz'$'6000Hz'$'8000Hz'$   Right ear:           Left ear:  Visual Acuity Screening   Right eye Left eye Both eyes  Without correction:     With correction: $RemoveBeforeDE'20/20 20/30 20/15 'MIQiWunjQSifbKN$  no optho recommended visit.   Dental: every 6 months.   Exercise: less recently, but restarting - gym exercise.   Declines STI testing.    History Patient Active Problem List   Diagnosis Date Noted  . Essential hypertension 01/14/2021  . Tongue pain 08/16/2018  . Cough 05/02/2017  . Acute bronchitis 05/02/2017  . PVC (premature ventricular contraction) 09/22/2016  . 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 Anxiety   . Epilepsy ([place])   . Hypertension   . Palpitations   . Seizures ([place])    Past Surgical History:  Procedure Laterality  Date  . SHOULDER ARTHROSCOPY W/ ACROMIAL REPAIR  2008   No Known Allergies Prior to Admission medications   Medication Sig Start Date End Date Taking? Authorizing Provider  levETIRAcetam (KEPPRA) 750 MG tablet Take 1 tablet (750 mg total) by mouth 2 (two) times daily. 09/07/20  Yes Suzzanne Cloud, NP  lisinopril (ZESTRIL) 10 MG tablet Take 1 tablet (10 mg total) by mouth daily. 07/14/20  Yes Wendie Agreste, MD  lisinopril (ZESTRIL) 5 MG tablet Take 1 tablet (5 mg total) by mouth daily. 07/14/20  Yes Wendie Agreste, MD   Social History   Socioeconomic History  . Marital status: Married    Spouse name: Diane   . Number of children: 1  . Years of education: Not on file  . Highest education level: Not on file  Occupational History  . Occupation: Scientist, clinical (histocompatibility and immunogenetics): [employer]  Tobacco Use  . Smoking status: Former Smoker    Years: 2.00    Types: Cigarettes    Quit date: 11/20/1996    Years since quitting: 24.1  . Smokeless tobacco: Never Used  . Tobacco comment: SMOKED IN HIGH SCHOOL AT LEAST 3 CIGARETTES PER DAY  Substance and Sexual Activity  . Alcohol use: Yes    Alcohol/week: 4.0 standard drinks    Types: 4 Cans of beer per week    Comment: 1 week  . Drug use: No  . Sexual activity: Yes    Comment: number of sex partners in the last 12 months 1  Other Topics Concern  . Not on file  Social History Narrative   Exercise doing weights and cardio 5 x/week for 1 hour.    Patient works at [employer]   Patient has some college.   Patient is married with one child.    Social Determinants of Health   Financial Resource Strain: Not on file  Food Insecurity: Not on file  Transportation Needs: Not on file  Physical Activity: Not on file  Stress: Not on file  Social Connections: Not on file  Intimate Partner Violence: Not on file    Review of Systems 13 point review of systems per patient health survey noted.  Negative other than as indicated above or in HPI.   Objective:    Vitals:   01/14/21 0807  BP: (!) 147/82  Pulse: 97  Resp: 17  Temp: 98.4 F (36.9 C)  TempSrc: Temporal  SpO2: 99%  Weight: 223 lb (101.2 kg)  Height: 6' (1.829 m)     Physical Exam Vitals reviewed.  Constitutional:      Appearance: He is well-developed and well-nourished.  HENT:     Head: Normocephalic and atraumatic.  Eyes:  Extraocular Movements: EOM normal.     Pupils: Pupils are equal, round, and reactive to light.  Neck:     Vascular: No carotid bruit or JVD.     Comments: No thyromegaly/nodule.  Cardiovascular:     Rate and Rhythm: Normal rate and regular rhythm.     Heart sounds: Normal heart sounds. No murmur heard.   Pulmonary:     Effort: Pulmonary effort is normal.     Breath sounds: Normal breath sounds. No rales.  Musculoskeletal:        General: No edema.  Skin:    General: Skin is warm and dry.  Neurological:     Mental Status: He is alert and oriented to person, place, and time.  Psychiatric:        Mood and Affect: Mood and affect normal.        Assessment & Plan:  MACHAI DESMITH is a 45 y.o. male . Annual physical exam  - -anticipatory guidance as below in AVS, screening labs above. Health maintenance items as above in HPI discussed/recommended as applicable.   Essential hypertension - Plan: lisinopril (ZESTRIL) 10 MG tablet, lisinopril (ZESTRIL) 5 MG tablet  - better on repeat test. Goal under 130/80. contineu same regimen for now.   Screening, anemia, deficiency, iron - Plan: CBC with Differential/Platelet  Screening for diabetes mellitus - Plan: Hemoglobin A1c  Hyperlipidemia, unspecified hyperlipidemia type - Plan: Lipid panel, Comprehensive metabolic panel  - plans improved diet, exercise.   Family history of thyroid cancer - Plan: TSH   Meds ordered this encounter  Medications  . lisinopril (ZESTRIL) 10 MG tablet    Sig: Take 1 tablet (10 mg total) by mouth daily.    Dispense:  90 tablet    Refill:  2  .  lisinopril (ZESTRIL) 5 MG tablet    Sig: Take 1 tablet (5 mg total) by mouth daily.    Dispense:  90 tablet    Refill:  2    DX Code Needed  .   Patient Instructions   I will check some labs. Can try changing diet, exercise approach initially if levels elevated.   If blood pressures above 130/80 at home, would consider $RemoveBefore'20mg'uMjmmBXdwoyrY$  lisinopril. Let me know. Repeat reading looks good here today.   Keeping you healthy  Get these tests  Blood pressure- Have your blood pressure checked once a year by your healthcare provider.  Normal blood pressure is 120/80.  Weight- Have your body mass index (BMI) calculated to screen for obesity.  BMI is a measure of body fat based on height and weight. You can also calculate your own BMI at [employer].  Cholesterol- Have your cholesterol checked regularly starting at age 53, sooner may be necessary if you have diabetes, high blood pressure, if a family member developed heart diseases at an early age or if you smoke.   Chlamydia, HIV, and other sexual transmitted disease- Get screened each year until the age of 64 then within three months of each new sexual partner.  Diabetes- Have your blood sugar checked regularly if you have high blood pressure, high cholesterol, a family history of diabetes or if you are overweight.  Get these vaccines  Flu shot- Every fall.  Tetanus shot- Every 10 years.  Menactra- Single dose; prevents meningitis.  Take these steps  Don't smoke- If you do smoke, ask your healthcare provider about quitting. For tips on how to quit, go to www.smokefree.gov or call 1-800-QUIT-NOW.  Be physically active- Exercise  5 days a week for at least 30 minutes.  If you are not already physically active start slow and gradually work up to 30 minutes of moderate physical activity.  Examples of moderate activity include walking briskly, mowing the yard, dancing, swimming bicycling, etc.  Eat a healthy diet- Eat a variety of healthy  foods such as fruits, vegetables, low fat milk, low fat cheese, yogurt, lean meats, poultry, fish, beans, tofu, etc.  For more information on healthy eating, go to www.thenutritionsource.org  Drink alcohol in moderation- Limit alcohol intake two drinks or less a day.  Never drink and drive.  Dentist- Brush and floss teeth twice daily; visit your dentis twice a year.  Depression-Your emotional health is as important as your physical health.  If you're feeling down, losing interest in things you normally enjoy please talk with your healthcare provider.  Gun Safety- If you keep a gun in your home, keep it unloaded and with the safety lock on.  Bullets should be stored separately.  Helmet use- Always wear a helmet when riding a motorcycle, bicycle, rollerblading or skateboarding.  Safe sex- If you may be exposed to a sexually transmitted infection, use a condom  Seat belts- Seat bels can save your life; always wear one.  Smoke/Carbon Monoxide detectors- These detectors need to be installed on the appropriate level of your home.  Replace batteries at least once a year.  Skin Cancer- When out in the sun, cover up and use sunscreen SPF 15 or higher.  Violence- If anyone is threatening or hurting you, please tell your healthcare provider.   If you have lab work done today you will be contacted with your lab results within the next 2 weeks.  If you have not heard from [place] then please contact us. The fastest way to get your results is to register for My Chart.   IF you received an x-ray today, you will receive an invoice from [facility]. Please contact [facility] at [phone] with questions or concerns regarding your invoice.   IF you received labwork today, you will receive an invoice from [place]. Please contact [facility] at [phone] with questions or concerns regarding your invoice.   Our billing staff will not be able to assist you with questions regarding bills from  these companies.  You will be contacted with the lab results as soon as they are available. The fastest way to get your results is to activate your My Chart account. Instructions are located on the last page of this paperwork. If you have not heard from [place] regarding the results in 2 weeks, please contact this office.         Signed, Merri Ray, MD [facility]

## 2021-01-14 NOTE — Patient Instructions (Addendum)
I will check some labs. Can try changing diet, exercise approach initially if levels elevated.   If blood pressures above 130/80 at home, would consider 20mg  lisinopril. Let me know. Repeat reading looks good here today.   Keeping you healthy  Get these tests  Blood pressure- Have your blood pressure checked once a year by your healthcare provider.  Normal blood pressure is 120/80.  Weight- Have your body mass index (BMI) calculated to screen for obesity.  BMI is a measure of body fat based on height and weight. You can also calculate your own BMI at .  Cholesterol- Have your cholesterol checked regularly starting at age 15, sooner may be necessary if you have diabetes, high blood pressure, if a family member developed heart diseases at an early age or if you smoke.   Chlamydia, HIV, and other sexual transmitted disease- Get screened each year until the age of 77 then within three months of each new sexual partner.  Diabetes- Have your blood sugar checked regularly if you have high blood pressure, high cholesterol, a family history of diabetes or if you are overweight.  Get these vaccines  Flu shot- Every fall.  Tetanus shot- Every 10 years.  Menactra- Single dose; prevents meningitis.  Take these steps  Don't smoke- If you do smoke, ask your healthcare provider about quitting. For tips on how to quit, go to www.smokefree.gov or call 1-800-QUIT-NOW.  Be physically active- Exercise 5 days a week for at least 30 minutes.  If you are not already physically active start slow and gradually work up to 30 minutes of moderate physical activity.  Examples of moderate activity include walking briskly, mowing the yard, dancing, swimming bicycling, etc.  Eat a healthy diet- Eat a variety of healthy foods such as fruits, vegetables, low fat milk, low fat cheese, yogurt, lean meats, poultry, fish, beans, tofu, etc.  For more information on healthy eating, go to  www.thenutritionsource.org  Drink alcohol in moderation- Limit alcohol intake two drinks or less a day.  Never drink and drive.  Dentist- Brush and floss teeth twice daily; visit your dentis twice a year.  Depression-Your emotional health is as important as your physical health.  If you're feeling down, losing interest in things you normally enjoy please talk with your healthcare provider.  Gun Safety- If you keep a gun in your home, keep it unloaded and with the safety lock on.  Bullets should be stored separately.  Helmet use- Always wear a helmet when riding a motorcycle, bicycle, rollerblading or skateboarding.  Safe sex- If you may be exposed to a sexually transmitted infection, use a condom  Seat belts- Seat bels can save your life; always wear one.  Smoke/Carbon Monoxide detectors- These detectors need to be installed on the appropriate level of your home.  Replace batteries at least once a year.  Skin Cancer- When out in the sun, cover up and use sunscreen SPF 15 or higher.  Violence- If anyone is threatening or hurting you, please tell your healthcare provider.   If you have lab work done today you will be contacted with your lab results within the next 2 weeks.  If you have not heard from [place] then please contact us. The fastest way to get your results is to register for My Chart.   IF you received an x-ray today, you will receive an invoice from [facility]. Please contact [facility] at [phone] with questions or concerns regarding your invoice.   IF you received  labwork today, you will receive an invoice from [facility]. Please contact [facility] at [phone] with questions or concerns regarding your invoice.   Our billing staff will not be able to assist you with questions regarding bills from these companies.  You will be contacted with the lab results as soon as they are available. The fastest way to get your results is to activate your My Chart  account. Instructions are located on the last page of this paperwork. If you have not heard from [place] regarding the results in 2 weeks, please contact this office.

## 2021-01-15 LAB — LIPID PANEL
Chol/HDL Ratio: 4.7 ratio (ref 0.0–5.0)
Cholesterol, Total: 252 mg/dL — ABNORMAL HIGH (ref 100–199)
HDL: 54 mg/dL (ref >39)
LDL Chol Calc (NIH): 183 mg/dL — ABNORMAL HIGH (ref 0–99)
Triglycerides: 86 mg/dL (ref 0–149)
VLDL Cholesterol Cal: 15 mg/dL (ref 5–40)

## 2021-01-15 LAB — COMPREHENSIVE METABOLIC PANEL
ALT: 21 IU/L (ref 0–44)
AST: 22 IU/L (ref 0–40)
Albumin/Globulin Ratio: 1.5 (ref 1.2–2.2)
Albumin: 4.3 g/dL (ref 4.0–5.0)
Alkaline Phosphatase: 35 IU/L — ABNORMAL LOW (ref 44–121)
BUN/Creatinine Ratio: 12 (ref 9–20)
BUN: 14 mg/dL (ref 6–24)
Bilirubin Total: 0.4 mg/dL (ref 0.0–1.2)
CO2: 21 mmol/L (ref 20–29)
Calcium: 9.6 mg/dL (ref 8.7–10.2)
Chloride: 101 mmol/L (ref 96–106)
Creatinine, Ser: 1.15 mg/dL (ref 0.76–1.27)
GFR calc Af Amer: 90 mL/min/{1.73_m2} (ref >59)
GFR calc non Af Amer: 78 mL/min/{1.73_m2} (ref >59)
Globulin, Total: 2.8 g/dL (ref 1.5–4.5)
Glucose: 94 mg/dL (ref 65–99)
Potassium: 4.8 mmol/L (ref 3.5–5.2)
Sodium: 138 mmol/L (ref 134–144)
Total Protein: 7.1 g/dL (ref 6.0–8.5)

## 2021-01-15 LAB — CBC WITH DIFFERENTIAL/PLATELET
Basophils Absolute: 0 10*3/uL (ref 0.0–0.2)
Basos: 1 %
EOS (ABSOLUTE): 0.1 10*3/uL (ref 0.0–0.4)
Eos: 2 %
Hematocrit: 45 % (ref 37.5–51.0)
Hemoglobin: 14.8 g/dL (ref 13.0–17.7)
Immature Grans (Abs): 0 10*3/uL (ref 0.0–0.1)
Immature Granulocytes: 0 %
Lymphocytes Absolute: 2.2 10*3/uL (ref 0.7–3.1)
Lymphs: 33 %
MCH: 26.7 pg (ref 26.6–33.0)
MCHC: 32.9 g/dL (ref 31.5–35.7)
MCV: 81 fL (ref 79–97)
Monocytes Absolute: 0.6 10*3/uL (ref 0.1–0.9)
Monocytes: 9 %
Neutrophils Absolute: 3.8 10*3/uL (ref 1.4–7.0)
Neutrophils: 55 %
Platelets: 313 10*3/uL (ref 150–450)
RBC: 5.55 x10E6/uL (ref 4.14–5.80)
RDW: 12.8 % (ref 11.6–15.4)
WBC: 6.7 10*3/uL (ref 3.4–10.8)

## 2021-01-15 LAB — TSH: TSH: 1.48 u[IU]/mL (ref 0.450–4.500)

## 2021-01-15 LAB — HEMOGLOBIN A1C
Est. average glucose Bld gHb Est-mCnc: 123 mg/dL
Hgb A1c MFr Bld: 5.9 % — ABNORMAL HIGH (ref 4.8–5.6)

## 2021-05-02 ENCOUNTER — Other Ambulatory Visit: Payer: Self-pay

## 2021-05-02 ENCOUNTER — Telehealth (INDEPENDENT_AMBULATORY_CARE_PROVIDER_SITE_OTHER): Payer: No Typology Code available for payment source | Admitting: Family Medicine

## 2021-05-02 ENCOUNTER — Encounter: Payer: Self-pay | Admitting: Family Medicine

## 2021-05-02 VITALS — Temp 101.0°F

## 2021-05-02 DIAGNOSIS — U071 COVID-19: Secondary | ICD-10-CM

## 2021-05-02 MED ORDER — MOLNUPIRAVIR EUA 200MG CAPSULE
4.0000 | ORAL_CAPSULE | Freq: Two times a day (BID) | ORAL | 0 refills | Status: AC
Start: 2021-05-02 — End: 2021-05-07

## 2021-05-02 NOTE — Patient Instructions (Signed)
° ° ° °  If you have lab work done today you will be contacted with your lab results within the next 2 weeks.  If you have not heard from us then please contact us. The fastest way to get your results is to register for My Chart. ° ° °IF you received an x-ray today, you will receive an invoice from [facility]. Please contact [facility] at [phone] with questions or concerns regarding your invoice.  ° °IF you received labwork today, you will receive an invoice from [facility]. Please contact [facility] at [phone] with questions or concerns regarding your invoice.  ° °Our billing staff will not be able to assist you with questions regarding bills from these companies. ° °You will be contacted with the lab results as soon as they are available. The fastest way to get your results is to activate your My Chart account. Instructions are located on the last page of this paperwork. If you have not heard from us regarding the results in 2 weeks, please contact this office. °  ° ° ° °

## 2021-05-02 NOTE — Progress Notes (Signed)
Virtual Visit via Video Note  I connected with Jim Fisher on 05/02/21 at 5:14 PM by a video enabled telemedicine application and verified that I am speaking with the correct person using two identifiers.  Patient location:home  My location: [place] office.    I discussed the limitations, risks, security and privacy concerns of performing an evaluation and management service by telephone and the availability of in person appointments. I also discussed with the patient that there may be a patient responsible charge related to this service. The patient expressed understanding and agreed to proceed, consent obtained  Chief complaint:  Chief Complaint  Patient presents with   Covid Positive    Patient states he has tested positive for Covid yesterday and today. Per patient he has been experiencing a  fever, cough, body aches, fatigue, and nasal congestion since . He has been taking some tylenol but has not had to take the medication today due to fever being gone.     History of Present Illness: Jim Fisher is a 45 y.o. male  Covid 60 infection: Cough, fever, body aches. Initial headache started 3 days ago. Thought was allergies after travel from [place] and off meds. Restarted allergy meds.   Bodyaches, chills yesterday at work. More cough into last night. Fever 101 last night.  Covid test yesterday evening positive. Repeat test this am positive.  No dyspnea. Some postnasal drip. Sore to cough this am. No current chest pains.  Eating and drinking ok. Better today than yesterday. Pain in hip last weekend - resolved now.   Tx: tylenol.   He did receive COVID-19 vaccine in March, April 2021 with booster October 20, 2020.  COVID risk of complications score of 3 (HTN, weight).    Patient Active Problem List   Diagnosis Date Noted   Essential hypertension 01/14/2021   Tongue pain 08/16/2018   Cough 05/02/2017   Acute bronchitis 05/02/2017   PVC (premature  ventricular contraction) 09/22/2016   Epilepsy, generalized, convulsive ([facility]) 12/24/2012   Past Medical History:  Diagnosis Date   Anxiety    Epilepsy ([facility])    Hypertension    Palpitations    Seizures ([facility])    Past Surgical History:  Procedure Laterality Date   SHOULDER ARTHROSCOPY W/ ACROMIAL REPAIR  2008   No Known Allergies Prior to Admission medications   Medication Sig Start Date End Date Taking? Authorizing Provider  levETIRAcetam (KEPPRA) 750 MG tablet Take 1 tablet (750 mg total) by mouth 2 (two) times daily. 09/07/20  Yes Glean Salvo, NP  lisinopril (ZESTRIL) 10 MG tablet Take 1 tablet (10 mg total) by mouth daily. 01/14/21  Yes Shade Flood, MD  lisinopril (ZESTRIL) 5 MG tablet Take 1 tablet (5 mg total) by mouth daily. 01/14/21  Yes Shade Flood, MD   Social History   Socioeconomic History   Marital status: Married    Spouse name: Diane    Number of children: 1   Years of education: Not on file   Highest education level: Not on file  Occupational History   Occupation: Investment banker, corporate: [employer]  Tobacco Use   Smoking status: Former    Years: 2.00    Pack years: 0.00    Types: Cigarettes    Quit date: 11/20/1996    Years since quitting: 24.4   Smokeless tobacco: Never   Tobacco comments:    SMOKED IN HIGH SCHOOL AT LEAST 3 CIGARETTES PER DAY  Substance and Sexual  Activity   Alcohol use: Yes    Alcohol/week: 4.0 standard drinks    Types: 4 Cans of beer per week    Comment: 1 week   Drug use: No   Sexual activity: Yes    Comment: number of sex partners in the last 12 months 1  Other Topics Concern   Not on file  Social History Narrative   Exercise doing weights and cardio 5 x/week for 1 hour.    Patient works at [employer].   Patient has some college.   Patient is married with one child.    Social Determinants of Health   Financial Resource Strain: Not on file  Food Insecurity: Not on file  Transportation Needs: Not on file  Physical  Activity: Not on file  Stress: Not on file  Social Connections: Not on file  Intimate Partner Violence: Not on file    Observations/Objective: Vitals:   05/02/21 1545  Temp: (!) 101 F (38.3 C)  TempSrc: Temporal  Temp above was for yesterday. Temp 99 today.  Nontoxic appearance on video.  Speaking in full sentences, no respiratory distress, appropriate responses.  All questions were answered with understanding of plan expressed.  Assessment and Plan: COVID-19 virus infection - Plan: molnupiravir EUA 200 mg CAPS Mild infection with risk of complications as above.  Some initial improvement possibly today.  Discussed treatment options, did elect to take antivirals. Molnupiravir Rx.  Potential side effects of meds discussed.  Continue Tylenol as needed, other symptomatic care along with isolation and return to work timing discussed.  Masking precautions discussed.  Urgent care/ER precautions given  Follow Up Instructions: If needed as above.    I discussed the assessment and treatment plan with the patient. The patient was provided an opportunity to ask questions and all were answered. The patient agreed with the plan and demonstrated an understanding of the instructions.   The patient was advised to call back or seek an in-person evaluation if the symptoms worsen or if the condition fails to improve as anticipated.  I provided 14 minutes of non-face-to-face time during this encounter.   Shade Flood, MD

## 2021-09-10 ENCOUNTER — Other Ambulatory Visit: Payer: Self-pay | Admitting: Neurology

## 2021-09-13 ENCOUNTER — Other Ambulatory Visit: Payer: Self-pay

## 2021-09-13 ENCOUNTER — Ambulatory Visit: Payer: BC Managed Care – PPO | Admitting: Neurology

## 2021-09-13 ENCOUNTER — Encounter: Payer: Self-pay | Admitting: Neurology

## 2021-09-13 VITALS — BP 126/84 | HR 84 | Ht 72.0 in | Wt 222.0 lb

## 2021-09-13 DIAGNOSIS — G40309 Generalized idiopathic epilepsy and epileptic syndromes, not intractable, without status epilepticus: Secondary | ICD-10-CM | POA: Diagnosis not present

## 2021-09-13 NOTE — Progress Notes (Signed)
Reason for visit: Seizures  Jim Fisher is an 45 y.o. male  History of present illness:  Jim Fisher is a 45 year old right-handed black male with a history of seizures.  The last seizure was in April 2021 when he tried to taper off of the Keppra after being seizure-free for about 10 years.  The patient has done well back on the Keppra.  He tolerates the medication well.  He continues to operate a Librarian, academic, he works for [employer].  He is being treated for hypertension and he has had an elevated hemoglobin A1c suggestive of prediabetes.  He has started to exercise on a regular basis and has gone on a low carbohydrate diet.  He returns for an evaluation.  Past Medical History:  Diagnosis Date   Anxiety    Epilepsy (HCC)    Hypertension    Palpitations    Seizures (HCC)     Past Surgical History:  Procedure Laterality Date   SHOULDER ARTHROSCOPY W/ ACROMIAL REPAIR  2008    Family History  Problem Relation Age of Onset   Hypertension Maternal Grandmother    Diabetes Maternal Grandmother     Social history:  reports that he quit smoking about 24 years ago. His smoking use included cigarettes. He has never used smokeless tobacco. He reports current alcohol use of about 4.0 standard drinks per week. He reports that he does not use drugs.   No Known Allergies  Medications:  Prior to Admission medications   Medication Sig Start Date End Date Taking? Authorizing Provider  levETIRAcetam (KEPPRA) 750 MG tablet TAKE 1 TABLET BY MOUTH 2 TIMES DAILY. 09/12/21  Yes Glean Salvo, NP  lisinopril (ZESTRIL) 10 MG tablet Take 1 tablet (10 mg total) by mouth daily. 01/14/21  Yes Shade Flood, MD  lisinopril (ZESTRIL) 5 MG tablet Take 1 tablet (5 mg total) by mouth daily. 01/14/21  Yes Shade Flood, MD    ROS:  Out of a complete 14 system review of symptoms, the patient complains only of the following symptoms, and all other reviewed systems are negative.  Seizures  Blood  pressure 126/84, pulse 84, height 6' (1.829 m), weight 222 lb (100.7 kg), SpO2 96 %.  Physical Exam  General: The patient is alert and cooperative at the time of the examination.  Skin: No significant peripheral edema is noted.   Neurologic Exam  Mental status: The patient is alert and oriented x 3 at the time of the examination. The patient has apparent normal recent and remote memory, with an apparently normal attention span and concentration ability.   Cranial nerves: Facial symmetry is present. Speech is normal, no aphasia or dysarthria is noted. Extraocular movements are full. Visual fields are full.  Motor: The patient has good strength in all 4 extremities.  Sensory examination: Soft touch sensation is symmetric on the face, arms, and legs.  Coordination: The patient has good finger-nose-finger and heel-to-shin bilaterally.  Gait and station: The patient has a normal gait. Tandem gait is normal. Romberg is negative. No drift is seen.  Reflexes: Deep tendon reflexes are symmetric.   Assessment/Plan:  1.  History of seizures  The patient is doing well on Keppra, we will continue the medication for now.  He will follow-up in 1 year.  In the future, he can be followed through Dr. Davis Gourd MD 09/13/2021 9:47 AM  [facility] [address] [place], [place] [address]  Phone [phone]  Fax [phone]

## 2021-12-08 ENCOUNTER — Other Ambulatory Visit: Payer: Self-pay | Admitting: Neurology

## 2021-12-08 NOTE — Telephone Encounter (Signed)
Rx refilled.

## 2021-12-22 ENCOUNTER — Other Ambulatory Visit: Payer: Self-pay | Admitting: Family Medicine

## 2021-12-22 DIAGNOSIS — I10 Essential (primary) hypertension: Secondary | ICD-10-CM

## 2021-12-30 ENCOUNTER — Other Ambulatory Visit: Payer: Self-pay | Admitting: Family Medicine

## 2021-12-30 DIAGNOSIS — I10 Essential (primary) hypertension: Secondary | ICD-10-CM

## 2022-06-11 ENCOUNTER — Other Ambulatory Visit: Payer: Self-pay | Admitting: Neurology

## 2022-06-21 ENCOUNTER — Encounter: Payer: Self-pay | Admitting: Family Medicine

## 2022-06-21 ENCOUNTER — Telehealth (INDEPENDENT_AMBULATORY_CARE_PROVIDER_SITE_OTHER): Payer: BC Managed Care – PPO | Admitting: Family Medicine

## 2022-06-21 VITALS — Ht 72.0 in | Wt 220.0 lb

## 2022-06-21 DIAGNOSIS — J22 Unspecified acute lower respiratory infection: Secondary | ICD-10-CM

## 2022-06-21 DIAGNOSIS — R052 Subacute cough: Secondary | ICD-10-CM | POA: Diagnosis not present

## 2022-06-21 DIAGNOSIS — R062 Wheezing: Secondary | ICD-10-CM | POA: Diagnosis not present

## 2022-06-21 MED ORDER — BENZONATATE 100 MG PO CAPS: 100.0000 mg | ORAL_CAPSULE | Freq: Three times a day (TID) | ORAL | 0 refills | Status: DC | PRN

## 2022-06-21 MED ORDER — AZITHROMYCIN 250 MG PO TABS: ORAL_TABLET | ORAL | 0 refills | Status: AC

## 2022-06-21 MED ORDER — ALBUTEROL SULFATE HFA 108 (90 BASE) MCG/ACT IN AERS
1.0000 | INHALATION_SPRAY | RESPIRATORY_TRACT | 0 refills | Status: DC | PRN
Start: 2022-06-21 — End: 2022-09-13

## 2022-06-21 NOTE — Patient Instructions (Addendum)
Cough was likely due to a virus, unlikely COVID with negative testing last week, but at this point repeat testing may not be all that helpful.  Expect cough to continue to improve.  Continue Mucinex, Tessalon Perles were prescribed as needed up to 3 times per day.  This should not cause sedation so can be used during the workday.  Albuterol was prescribed if needed for wheezing, let me know if you need that medication frequently.  If not continuing to improve through the weekend I did send in an antibiotic but may not need that at this point.  Hope you feel better soon.  Let me know if there are questions.  cough, Adult Coughing is a reflex that clears your throat and your airways (respiratory system). Coughing helps to heal and protect your lungs. It is normal to cough occasionally, but a cough that happens with other symptoms or lasts a long time may be a sign of a condition that needs treatment. An acute cough may only last 2-3 weeks, while a chronic cough may last 8 or more weeks. Coughing is commonly caused by: Infection of the respiratory systemby viruses or bacteria. Breathing in substances that irritate your lungs. Allergies. Asthma. Mucus that runs down the back of your throat (postnasal drip). Smoking. Acid backing up from the stomach into the esophagus (gastroesophageal reflux). Certain medicines. Chronic lung problems. Other medical conditions such as heart failure or a blood clot in the lung (pulmonary embolism). Follow these instructions at home: Medicines Take over-the-counter and prescription medicines only as told by your health care provider. Talk with your health care provider before you take a cough suppressant medicine. Lifestyle  Avoid cigarette smoke. Do not use any products that contain nicotine or tobacco, such as cigarettes, e-cigarettes, and chewing tobacco. If you need help quitting, ask your health care provider. Drink enough fluid to keep your urine pale  yellow. Avoid caffeine. Do not drink alcohol if your health care provider tells you not to drink. General instructions  Pay close attention to changes in your cough. Tell your health care provider about them. Always cover your mouth when you cough. Avoid things that make you cough, such as perfume, candles, cleaning products, or campfire or tobacco smoke. If the air is dry, use a cool mist vaporizer or humidifier in your bedroom or your home to help loosen secretions. If your cough is worse at night, try to sleep in a semi-upright position. Rest as needed. Keep all follow-up visits as told by your health care provider. This is important. Contact a health care provider if you: Have new symptoms. Cough up pus. Have a cough that does not get better after 2-3 weeks or gets worse. Cannot control your cough with cough suppressant medicines and you are losing sleep. Have pain that gets worse or pain that is not helped with medicine. Have a fever. Have unexplained weight loss. Have night sweats. Get help right away if: You cough up blood. You have difficulty breathing. Your heartbeat is very fast. These symptoms may represent a serious problem that is an emergency. Do not wait to see if the symptoms will go away. Get medical help right away. Call your local emergency services (911 in the [place] Do not drive yourself to the hospital. Summary Coughing is a reflex that clears your throat and your airways. It is normal to cough occasionally, but a cough that happens with other symptoms or lasts a long time may be a sign of a condition that  needs treatment. Take over-the-counter and prescription medicines only as told by your health care provider. Always cover your mouth when you cough. Contact a health care provider if you have new symptoms or a cough that does not get better after 2-3 weeks or gets worse. This information is not intended to replace advice given to you by your health care  provider. Make sure you discuss any questions you have with your health care provider. Document Revised: 11/25/2018 Document Reviewed: 11/25/2018 Elsevier Patient Education  2023 [employer].

## 2022-06-21 NOTE — Progress Notes (Signed)
Virtual Visit via Video Note  I connected with Jim Fisher on 06/21/22 at 9:24 AM by a video enabled telemedicine application and verified that I am speaking with the correct person using two identifiers.  Patient location: home - by self My location: office - [place].    I discussed the limitations, risks, security and privacy concerns of performing an evaluation and management service by telephone and the availability of in person appointments. I also discussed with the patient that there may be a patient responsible charge related to this service. The patient expressed understanding and agreed to proceed, consent obtained  Chief complaint:  Chief Complaint  Patient presents with   Cough    Productive cough pt has had a covid test that came back negative last week, wheezing if breathe heavy , pt states been going on for a week and half, pt has taken mucinex     History of Present Illness: Jim Fisher is a 46 y.o. male  Cough: Started around 7/23. Some pnd at that time, triggering cough, dry cough initially, then productive past 2 days. Overall feeing better - less cough, more productive. Cough with talking. Yellow phlegm.  No fever/dyspnea. Wheeze feeling with forced expiration - noise with expiration. Not with inspiration. No hx of asthma.  No chest pain. Drinking fluids.  Cough waking up at night at times.  Covid test 6 days ago - negative. Wife and granddaughter with cough.  Tx: mucinex last week.   COVID-19 infection June 2022. 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 Authorizing Provider  levETIRAcetam (KEPPRA) 750 MG tablet TAKE 1 TABLET BY MOUTH TWICE A DAY 06/12/22  Yes Glean Salvo, NP  lisinopril (ZESTRIL) 10 MG tablet TAKE 1 TABLET BY MOUTH EVERY DAY 12/22/21  Yes Shade Flood, MD  lisinopril (ZESTRIL) 5 MG tablet TAKE 1 TABLET (5 MG TOTAL) BY MOUTH DAILY. 12/30/21  Yes Shade Flood, MD   Social History   Socioeconomic History   Marital status: Married    Spouse name: Diane    Number of children: 1   Years of education: Not on file   Highest education level: Not on file  Occupational History   Occupation: Investment banker, corporate: [employer]  Tobacco Use   Smoking status: Former    Years: 2.00    Types: Cigarettes    Quit date: 11/20/1996    Years since quitting: 25.6   Smokeless tobacco: Never   Tobacco comments:    SMOKED IN HIGH SCHOOL AT LEAST 3 CIGARETTES PER DAY  Substance and Sexual Activity   Alcohol use: Yes    Alcohol/week: 4.0 standard drinks of alcohol    Types: 4  Cans of beer per week    Comment: 1 week   Drug use: No   Sexual activity: Yes    Comment: number of sex partners in the last 12 months 1  Other Topics Concern   Not on file  Social History Narrative   Exercise doing weights and cardio 5 x/week for 1 hour.    Patient works at [employer].   Patient has some college.   Patient is married with one child.    Social Determinants of Health   Financial Resource Strain: Not on file  Food Insecurity: Not on file  Transportation Needs: Not on file  Physical Activity: Not on file  Stress: Not on file  Social Connections:  Not on file  Intimate Partner Violence: Not on file    Observations/Objective: Vitals:   06/21/22 0907  Weight: 220 lb (99.8 kg)  Height: 6' (1.829 m)  Nontoxic appearance on video.  Speaking in full sentences, no respiratory distress.  Coherent responses.  No audible wheeze.  Few coughs during visit.  All questions were answered with understanding of plan expressed.    Assessment and Plan: Subacute cough - Plan: benzonatate (TESSALON) 100 MG capsule  LRTI (lower respiratory tract infection) - Plan: azithromycin (ZITHROMAX) 250 MG tablet  Wheeze - Plan: albuterol (VENTOLIN HFA) 108 (90 Base) MCG/ACT inhaler  Suspected initial viral illness, now productive cough, may still be a viral illness.  Symptomatic care discussed with Mucinex, Tessalon, albuterol if needed for wheezing with RTC precautions if frequent use.  If not improving through the weekend, azithromycin prescribed for possible secondary bacterial/lower respiratory infection but less likely.  Potential side effects discussed.  RTC/ER precautions given.  Handout given for cough  Follow Up Instructions:  As needed with RTC/ER precautions given.    I discussed the assessment and treatment plan with the patient. The patient was provided an opportunity to ask questions and all were answered. The patient agreed with the plan and demonstrated an understanding of the instructions.   The patient was advised to call back or seek an in-person evaluation if the symptoms worsen or if the condition fails to improve as anticipated.   Shade Flood, MD

## 2022-09-12 ENCOUNTER — Other Ambulatory Visit: Payer: Self-pay | Admitting: Family Medicine

## 2022-09-12 DIAGNOSIS — I10 Essential (primary) hypertension: Secondary | ICD-10-CM

## 2022-09-13 ENCOUNTER — Ambulatory Visit: Payer: BC Managed Care – PPO | Admitting: Neurology

## 2022-09-13 ENCOUNTER — Encounter: Payer: Self-pay | Admitting: Neurology

## 2022-09-13 VITALS — BP 141/92 | HR 79 | Ht 72.0 in | Wt 218.5 lb

## 2022-09-13 DIAGNOSIS — G40309 Generalized idiopathic epilepsy and epileptic syndromes, not intractable, without status epilepticus: Secondary | ICD-10-CM | POA: Diagnosis not present

## 2022-09-13 MED ORDER — LEVETIRACETAM 750 MG PO TABS: 750.0000 mg | ORAL_TABLET | Freq: Two times a day (BID) | ORAL | 4 refills | Status: DC

## 2022-09-13 NOTE — Progress Notes (Signed)
Patient: Jim Fisher Date of Birth: Jan 29, 1976  Reason for Visit: Follow up History from: Patient Primary Neurologist: Jim Fisher  ASSESSMENT AND PLAN 46 y.o. year old male   1.  History of seizures -Doing well on Keppra -Continue Keppra 750 mg twice daily -Call for seizures, otherwise follow-up in 1 year, 15 min VV  HISTORY OF PRESENT ILLNESS: Today 09/13/22 Jim Fisher is here today for follow-up.  Remains on Keppra.  Last seizure was in April 2021 when he tried to taper off Keppra (had also missed 2 doses around the same time, got the COVID vaccine). No side effects reported. Continues to work at [employer]. No new issues.   HISTORY 09/13/21 Dr. Anne Fisher: Mr. Jim Fisher is a 46 year old right-handed black male with a history of seizures.  The last seizure was in April 2021 when he tried to taper off of the Keppra after being seizure-free for about 10 years.  The patient has done well back on the Keppra.  He tolerates the medication well.  He continues to operate a Librarian, academic, he works for [employer].  He is being treated for hypertension and he has had an elevated hemoglobin A1c suggestive of prediabetes.  He has started to exercise on a regular basis and has gone on a low carbohydrate diet.  He returns for an evaluation.   REVIEW OF SYSTEMS: Out of a complete 14 system review of symptoms, the patient complains only of the following symptoms, and all other reviewed systems are negative.  See HPI  ALLERGIES: No Known Allergies  HOME MEDICATIONS: Outpatient Medications Prior to Visit  Medication Sig Dispense Refill   lisinopril (ZESTRIL) 10 MG tablet TAKE 1 TABLET BY MOUTH EVERY DAY 90 tablet 2   lisinopril (ZESTRIL) 5 MG tablet TAKE 1 TABLET (5 MG TOTAL) BY MOUTH DAILY. 90 tablet 2   levETIRAcetam (KEPPRA) 750 MG tablet TAKE 1 TABLET BY MOUTH TWICE A DAY 180 tablet 1   albuterol (VENTOLIN HFA) 108 (90 Base) MCG/ACT inhaler Inhale 1-2 puffs into the lungs every 4 (four) hours  as needed for wheezing or shortness of breath. 1 each 0   benzonatate (TESSALON) 100 MG capsule Take 1 capsule (100 mg total) by mouth 3 (three) times daily as needed for cough. 20 capsule 0   No facility-administered medications prior to visit.    PAST MEDICAL HISTORY: Past Medical History:  Diagnosis Date   Anxiety    Epilepsy (HCC)    Hypertension    Palpitations    Seizures (HCC)     PAST SURGICAL HISTORY: Past Surgical History:  Procedure Laterality Date   SHOULDER ARTHROSCOPY W/ ACROMIAL REPAIR  2008    FAMILY HISTORY: Family History  Problem Relation Age of Onset   Hypertension Maternal Grandmother    Diabetes Maternal Grandmother     SOCIAL HISTORY: Social History   Socioeconomic History   Marital status: Married    Spouse name: Jim Fisher    Number of children: 1   Years of education: Not on file   Highest education level: Not on file  Occupational History   Occupation: Investment banker, 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[employer]  Tobacco Use   Smoking status: Former    Years: 2.00    Types: Cigarettes    Quit date: 11/20/1996    Years since quitting: 25.8   Smokeless tobacco: Never   Tobacco comments:    SMOKED IN HIGH SCHOOL AT LEAST 3 CIGARETTES PER DAY  Substance and Sexual Activity   Alcohol  use: Yes    Alcohol/week: 4.0 standard drinks of alcohol    Types: 4 Cans of beer per week    Comment: 1 week   Drug use: No   Sexual activity: Yes    Comment: number of sex partners in the last 12 months 1  Other Topics Concern   Not on file  Social History Narrative   Exercise doing weights and cardio 5 x/week for 1 hour.    Patient works at [employer].   Patient has some college.   Patient is married with one child.    Social Determinants of Health   Financial Resource Strain: Not on file  Food Insecurity: Not on file  Transportation Needs: Not on file  Physical Activity: Not on file  Stress: Not on file  Social Connections: Not on file  Intimate Partner Violence: Not on file    PHYSICAL EXAM  Vitals:   09/13/22 1006  BP: (!) 141/92  Pulse: 79  Weight: 218 lb 8 oz (99.1 kg)  Height: 6' (1.829 m)   Body mass index is 29.63 kg/m.  Generalized: Well developed, in no acute distress  Neurological examination  Mentation: Alert oriented to time, place, history taking. Follows all commands speech and language fluent Cranial nerve II-XII: Pupils were equal round reactive to light. Extraocular movements were full, visual field were full on confrontational test. Facial sensation and strength were normal.  Head turning and shoulder shrug  were normal and symmetric. Motor: The motor testing reveals 5 over 5 strength of all 4 extremities. Good symmetric motor tone is noted throughout.  Sensory: Sensory testing is intact to soft touch on all 4 extremities. No evidence of extinction is noted.  Coordination: Cerebellar testing reveals good finger-nose-finger and heel-to-shin bilaterally.  Gait and station: Gait is normal.  Reflexes: Deep tendon reflexes are symmetric and normal bilaterally.   DIAGNOSTIC DATA (LABS, IMAGING, TESTING) - I reviewed patient records, labs, notes, testing and imaging myself where available.  Lab Results  Component Value Date   WBC 6.7 01/14/2021   HGB 14.8 01/14/2021   HCT 45.0 01/14/2021   MCV 81 01/14/2021   PLT 313 01/14/2021      Component Value Date/Time   NA 138 01/14/2021 0943   K 4.8 01/14/2021 0943   CL 101 01/14/2021 0943   CO2 21 01/14/2021 0943   GLUCOSE 94 01/14/2021 0943   GLUCOSE 104 (H) 09/18/2016 1510   BUN 14 01/14/2021 0943   CREATININE 1.15 01/14/2021 0943   CREATININE 1.18 03/19/2014 1906   CALCIUM 9.6 01/14/2021 0943   PROT 7.1 01/14/2021 0943   ALBUMIN 4.3 01/14/2021 0943   AST 22 01/14/2021 0943   ALT 21 01/14/2021 0943   ALKPHOS 35 (L) 01/14/2021 0943   BILITOT 0.4 01/14/2021 0943   GFRNONAA 78 01/14/2021 0943   GFRAA 90 01/14/2021 0943   Lab Results  Component Value Date   CHOL 252 (H)  01/14/2021   HDL 54 01/14/2021   LDLCALC 183 (H) 01/14/2021   TRIG 86 01/14/2021   CHOLHDL 4.7 01/14/2021   Lab Results  Component Value Date   HGBA1C 5.9 (H) 01/14/2021   No results found for: "VITAMINB12" Lab Results  Component Value Date   TSH 1.480 01/14/2021    Butler Denmark, AGNP-C, DNP 09/13/2022, 10:29 AM [facility] [address] [place], [place] [address] [phone]

## 2023-03-26 ENCOUNTER — Encounter: Payer: Self-pay | Admitting: Family Medicine

## 2023-03-26 ENCOUNTER — Ambulatory Visit: Payer: BC Managed Care – PPO | Admitting: Family Medicine

## 2023-03-26 VITALS — BP 128/74 | HR 71 | Temp 98.7°F | Ht 72.0 in | Wt 223.4 lb

## 2023-03-26 DIAGNOSIS — R599 Enlarged lymph nodes, unspecified: Secondary | ICD-10-CM

## 2023-03-26 NOTE — Progress Notes (Signed)
Subjective:  Patient ID: Jim Fisher, male    DOB: 26-Feb-1976  Age: 47 y.o. MRN: [id number]  CC:  Chief Complaint  Patient presents with   Groin Pain    Pt states he has a lump on left side of groin Pain when he pushes on it x2 weeks     HPI Jim Fisher presents for   L groin pain: Noticed 2 weeks ago after yardwork - sore area. Thought may be lymph node form HSV flare. Sore to press on area or with pants pressing on area. Able to partially reduce prior - smaller now. No recent flare of HSV - few months ago.  No hx of hernia.  No pain with lifting/bearing down, exercising.  No fever or other swollen areas.  Did have tinea cruris/jock itch few weeks ago - treated with lotrimin and better now.  No fever, night sweats, weight loss.  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 Authorizing Provider  levETIRAcetam (KEPPRA) 750 MG tablet Take 1 tablet (750 mg total) by mouth 2 (two) times daily. 09/13/22  Yes Glean Salvo, NP  lisinopril (ZESTRIL) 10 MG tablet TAKE 1 TABLET BY MOUTH EVERY DAY 09/12/22  Yes Shade Flood, MD  lisinopril (ZESTRIL) 5 MG tablet TAKE 1 TABLET (5 MG TOTAL) BY MOUTH DAILY. 09/12/22  Yes Shade Flood, MD   Social History   Socioeconomic History   Marital status: Married    Spouse name: Jim Fisher    Number of children: 1   Years of education: Not on file   Highest education level: Not on  file  Occupational History   Occupation: Investment banker, corporate: [employer]  Tobacco Use   Smoking status: Former    Years: 2    Types: Cigarettes    Quit date: 11/20/1996    Years since quitting: 26.3   Smokeless tobacco: Never   Tobacco comments:    SMOKED IN HIGH SCHOOL AT LEAST 3 CIGARETTES PER DAY  Substance and Sexual Activity   Alcohol use: Yes    Alcohol/week: 4.0 standard drinks of alcohol    Types: 4 Cans of beer per week    Comment: 1 week   Drug use: No   Sexual activity: Yes    Comment: number of sex partners in the last 12 months 1  Other Topics Concern   Not on file  Social History Narrative   Exercise doing weights and cardio 5 x/week for 1 hour.    Patient works at [employer].   Patient has some college.   Patient is married with one child.    Social Determinants of Health   Financial Resource Strain: Not on file  Food Insecurity: Not on file  Transportation Needs: Not on file  Physical Activity: Not on file  Stress: Not on file  Social Connections: Not on file  Intimate Partner Violence: Not on  file    Review of Systems   Objective:   Vitals:   03/26/23 1402  BP: 128/74  Pulse: 71  Temp: 98.7 F (37.1 C)  TempSrc: Temporal  SpO2: 100%  Weight: 223 lb 6.4 oz (101.3 kg)  Height: 6' (1.829 m)     Physical Exam Constitutional:      General: He is not in acute distress.    Appearance: Normal appearance. He is well-developed.  HENT:     Head: Normocephalic and atraumatic.  Cardiovascular:     Rate and Rhythm: Normal rate.  Pulmonary:     Effort: Pulmonary effort is normal.  Abdominal:     Hernia: There is no hernia in the left inguinal area or right inguinal area.  Genitourinary:   Lymphadenopathy:     Lower Body: No right inguinal adenopathy. Left inguinal adenopathy present.  Neurological:     Mental Status: He is alert and oriented to person, place, and time.  Psychiatric:        Mood and Affect: Mood normal.        Assessment &  Plan:  Jim Fisher is a 47 y.o. male . Reactive lymphadenopathy Suspected reactive lymphadenopathy from recent tinea cruris.  Reports improvement in the area, smaller size.  This would fit with a reactive lymphadenopathy from prior infection and should continue to improve.  Hold on imaging or antibiotics at this time.  Update by MyChart next week or 2 and consider ultrasound versus antibiotic trial at that time if persistent.  RTC precautions if worse.  No orders of the defined types were placed in this encounter.  Patient Instructions  I suspect the bump is possibly a reactive lymph node from previous skin infection.  As that is improving I think we can continue to watch it for now.  Give me an update in the next week or 2 and if it does not continue to improve or resolve I can order imaging at that time.  If any new or worsening symptoms in the interim, please follow-up.  Let me know if there are questions.    Signed,   Meredith Staggers, MD [facility], [facility] 03/26/23 2:26 PM

## 2023-03-26 NOTE — Patient Instructions (Signed)
I suspect the bump is possibly a reactive lymph node from previous skin infection.  As that is improving I think we can continue to watch it for now.  Give me an update in the next week or 2 and if it does not continue to improve or resolve I can order imaging at that time.  If any new or worsening symptoms in the interim, please follow-up.  Let me know if there are questions.

## 2023-06-13 ENCOUNTER — Other Ambulatory Visit: Payer: Self-pay | Admitting: Family Medicine

## 2023-06-13 DIAGNOSIS — I10 Essential (primary) hypertension: Secondary | ICD-10-CM

## 2023-09-17 NOTE — Progress Notes (Unsigned)
   Virtual Visit via Video Note  I connected with Jim Fisher on 09/17/23 at  8:45 AM EDT by a video enabled telemedicine application and verified that I am speaking with the correct person using two identifiers.  Location: Patient: at his home Provider: in the office    I discussed the limitations of evaluation and management by telemedicine and the availability of in person appointments. The patient expressed understanding and agreed to proceed.  History of Present Illness: 09/18/23 SS: Via VV, no seizures. Remains on Keppra 750 mg BID. Reports great compliance. Health has been good.  She is looking for a new primary care doctor.  Continues to work at [employer].  No new issues or concerns.  09/13/22 SS: Rosalio Macadamia is here today for follow-up.  Remains on Keppra.  Last seizure was in April 2021 when he tried to taper off Keppra (had also missed 2 doses around the same time, got the COVID vaccine). No side effects reported. Continues to work at [employer]. No new issues.    HISTORY 09/13/21 Dr. Anne Hahn: Mr. Buntin is a 47 year old right-handed black male with a history of seizures.  The last seizure was in April 2021 when he tried to taper off of the Keppra after being seizure-free for about 10 years.  The patient has done well back on the Keppra.  He tolerates the medication well.  He continues to operate a Librarian, academic, he works for [employer].  He is being treated for hypertension and he has had an elevated hemoglobin A1c suggestive of prediabetes.  He has started to exercise on a regular basis and has gone on a low carbohydrate diet.  He returns for an evaluation.   Observations/Objective: Via video visit, is alert and oriented, speech is clear and concise, facial symmetry noted, moves about freely.  Assessment and Plan: 1.  History of seizures  -Doing great, continue Keppra 750 mg twice daily -Call for seizure activity, otherwise follow-up 1 year  Meds ordered this encounter   Medications   levETIRAcetam (KEPPRA) 750 MG tablet    Sig: Take 1 tablet (750 mg total) by mouth 2 (two) times daily.    Dispense:  180 tablet    Refill:  4     Follow Up Instructions: 15 min VV with me   I discussed the assessment and treatment plan with the patient. The patient was provided an opportunity to ask questions and all were answered. The patient agreed with the plan and demonstrated an understanding of the instructions.   The patient was advised to call back or seek an in-person evaluation if the symptoms worsen or if the condition fails to improve as anticipated.   Otila Kluver, DNP  [facility] [address], [address] [place], [place] [address] [phone]

## 2023-09-18 ENCOUNTER — Telehealth (INDEPENDENT_AMBULATORY_CARE_PROVIDER_SITE_OTHER): Payer: BC Managed Care – PPO | Admitting: Neurology

## 2023-09-18 DIAGNOSIS — R569 Unspecified convulsions: Secondary | ICD-10-CM | POA: Diagnosis not present

## 2023-09-18 DIAGNOSIS — G40309 Generalized idiopathic epilepsy and epileptic syndromes, not intractable, without status epilepticus: Secondary | ICD-10-CM

## 2023-09-18 MED ORDER — LEVETIRACETAM 750 MG PO TABS: 750.0000 mg | ORAL_TABLET | Freq: Two times a day (BID) | ORAL | 4 refills | Status: DC

## 2023-09-18 NOTE — Patient Instructions (Signed)
Great to see you today.  I am glad you are doing so well!!  We will continue Keppra.  I sent a refill to your pharmacy.  See back in 1 year.  Call for any seizure related issues.  Thanks!!

## 2023-11-07 DIAGNOSIS — R8271 Bacteriuria: Secondary | ICD-10-CM | POA: Diagnosis not present

## 2024-03-27 ENCOUNTER — Ambulatory Visit: Admitting: Family Medicine

## 2024-03-27 VITALS — BP 128/72 | HR 76 | Temp 98.1°F | Ht 71.75 in | Wt 217.4 lb

## 2024-03-27 DIAGNOSIS — E785 Hyperlipidemia, unspecified: Secondary | ICD-10-CM | POA: Diagnosis not present

## 2024-03-27 DIAGNOSIS — I10 Essential (primary) hypertension: Secondary | ICD-10-CM | POA: Diagnosis not present

## 2024-03-27 DIAGNOSIS — Z0000 Encounter for general adult medical examination without abnormal findings: Secondary | ICD-10-CM | POA: Diagnosis not present

## 2024-03-27 DIAGNOSIS — Z23 Encounter for immunization: Secondary | ICD-10-CM

## 2024-03-27 DIAGNOSIS — Z1211 Encounter for screening for malignant neoplasm of colon: Secondary | ICD-10-CM

## 2024-03-27 LAB — COMPREHENSIVE METABOLIC PANEL WITH GFR
ALT: 21 U/L (ref 0–53)
AST: 18 U/L (ref 0–37)
Albumin: 4.6 g/dL (ref 3.5–5.2)
Alkaline Phosphatase: 34 U/L — ABNORMAL LOW (ref 39–117)
BUN: 15 mg/dL (ref 6–23)
CO2: 26 meq/L (ref 19–32)
Calcium: 9.6 mg/dL (ref 8.4–10.5)
Chloride: 102 meq/L (ref 96–112)
Creatinine, Ser: 1.06 mg/dL (ref 0.40–1.50)
GFR: 84.38 mL/min (ref >60.00)
Glucose, Bld: 97 mg/dL (ref 70–99)
Potassium: 4.1 meq/L (ref 3.5–5.1)
Sodium: 136 meq/L (ref 135–145)
Total Bilirubin: 0.5 mg/dL (ref 0.2–1.2)
Total Protein: 7.6 g/dL (ref 6.0–8.3)

## 2024-03-27 LAB — CBC
HCT: 45.6 % (ref 39.0–52.0)
Hemoglobin: 15 g/dL (ref 13.0–17.0)
MCHC: 32.9 g/dL (ref 30.0–36.0)
MCV: 84 fl (ref 78.0–100.0)
Platelets: 364 10*3/uL (ref 150.0–400.0)
RBC: 5.43 Mil/uL (ref 4.22–5.81)
RDW: 13.1 % (ref 11.5–15.5)
WBC: 7.8 10*3/uL (ref 4.0–10.5)

## 2024-03-27 LAB — LIPID PANEL
Cholesterol: 269 mg/dL — ABNORMAL HIGH (ref 0–200)
HDL: 49.4 mg/dL (ref >39.00)
LDL Cholesterol: 198 mg/dL — ABNORMAL HIGH (ref 0–99)
NonHDL: 219.83
Total CHOL/HDL Ratio: 5
Triglycerides: 108 mg/dL (ref 0.0–149.0)
VLDL: 21.6 mg/dL (ref 0.0–40.0)

## 2024-03-27 LAB — TSH: TSH: 1.58 u[IU]/mL (ref 0.35–5.50)

## 2024-03-27 MED ORDER — LISINOPRIL 5 MG PO TABS: 5.0000 mg | ORAL_TABLET | Freq: Every day | ORAL | 2 refills | Status: DC

## 2024-03-27 MED ORDER — LISINOPRIL 10 MG PO TABS: 10.0000 mg | ORAL_TABLET | Freq: Every day | ORAL | 2 refills | Status: DC

## 2024-03-27 NOTE — Progress Notes (Signed)
 Subjective:  Patient ID: Jim Fisher, male    DOB: July 04, 1976  Age: 48 y.o. MRN: [id number]  CC:  Chief Complaint  Patient presents with   Annual Exam    Pt is doing well, no concerns, pt is fasting    HPI Jim Fisher presents for Annual Exam  PCP, me Neuro, Jeanmarie Millet, NP, epilepsy, visit in October 2024.  Stable, continued Keppra  750 mg twice daily.  1 year follow-up. No seizures. No side effects.   Hypertension: Treated with lisinopril , 15 mg total dose.  Prior cardiology eval for PVCs.  Decreased with lessening caffeine intake. No new cough, side effects.  Home readings: none.  Checked at dentist - usually ok.  BP Readings from Last 3 Encounters:  03/27/24 128/72  03/26/23 128/74  09/13/22 (!) 141/92   Lab Results  Component Value Date   CREATININE 1.15 01/14/2021   Hyperlipidemia: Hyperlipidemia with low ASCVD risk or previously.  Some increased cholesterol with dietary changes/loss/adherence.  Plan diet changes, exercises, last checked in 2022.  Weight has improved since that time. Still some room for improvement in diet/exercise.   No FH of early CAD.  Lab Results  Component Value Date   CHOL 252 (H) 01/14/2021   HDL 54 01/14/2021   LDLCALC 183 (H) 01/14/2021   TRIG 86 01/14/2021   CHOLHDL 4.7 01/14/2021   Lab Results  Component Value Date   ALT 21 01/14/2021   AST 22 01/14/2021   ALKPHOS 35 (L) 01/14/2021   BILITOT 0.4 01/14/2021   Wt Readings from Last 3 Encounters:  03/27/24 217 lb 6.4 oz (98.6 kg)  03/26/23 223 lb 6.4 oz (101.3 kg)  09/13/22 218 lb 8 oz (99.1 kg)      03/27/2024    8:31 AM 03/26/2023    1:33 PM 06/21/2022    9:06 AM 05/02/2021    3:47 PM 01/14/2021    8:09 AM  Depression screen PHQ 2/9  Decreased Interest 0 0 0 0 0  Down, Depressed, Hopeless 0 0 0 0 0  PHQ - 2 Score 0 0 0 0 0  Altered sleeping 0 0 0    Tired, decreased energy 1 0 0    Change in appetite 0 0 0    Feeling bad or failure about yourself  0 0 0    Trouble  concentrating 0 0 0    Moving slowly or fidgety/restless 0 0 0    Suicidal thoughts 0 0 0    PHQ-9 Score 1 0 0    Difficult doing work/chores Not difficult at all Not difficult at all       Health Maintenance  Topic Date Due   DTaP/Tdap/Td (2 - Td or Tdap) 07/27/2021   Colonoscopy  Never done   COVID-19 Vaccine (4 - 2024-25 season) 07/22/2023   INFLUENZA VACCINE  06/20/2024   Hepatitis C Screening  Completed   HIV Screening  Completed   HPV VACCINES  Aged Out   Meningococcal B Vaccine  Aged Out  No FH of colon CA. No personal hx of polyps. Screening options with colonoscopy versus Cologuard discussed. Discussed timing of repeat testing intervals if normal, as well as potential need for diagnostic Colonoscopy if positive Cologuard. Understanding expressed, and chose Colonoscopy. Prostate: does not have family history of prostate cancer The natural history of prostate cancer and ongoing controversy regarding screening and potential treatment outcomes of prostate cancer has been discussed with the patient. The meaning of a false positive PSA  and a false negative PSA has been discussed. He indicates understanding of the limitations of this screening test and wishes to NOT proceed with screening PSA testing. No results found for: "PSA1", "PSA"  Immunization History  Administered Date(s) Administered   Influenza Split 11/25/2012   Influenza,inj,Quad PF,6+ Mos 09/22/2019, 07/14/2020   PFIZER(Purple Top)SARS-COV-2 Vaccination 01/22/2020, 02/19/2020, 10/20/2020   Tdap 07/28/2011  Updated tdap today.  Covid booster - defers. Seizure few days after prior booster.  Flu vaccine recommended this fall.   No results found. Glasses on occasion. Optho eval last year.   Dental: every 6 months.   Alcohol: few beers on Sundays, 1 cigar per week.   Tobacco: as above.   Exercise: yardwork. Plans on increased exercise, prior gym exercise month ago. Temporary back issues.   Declines STI screening.    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 Authorizing Provider  levETIRAcetam  (KEPPRA ) 750 MG tablet Take 1 tablet (750 mg total) by mouth 2 (two) times daily. 09/18/23  Yes Wess Hammed, NP  lisinopril  (ZESTRIL ) 10 MG tablet TAKE 1 TABLET BY MOUTH EVERY DAY 06/13/23  Yes Benjiman Bras, MD  lisinopril  (ZESTRIL ) 5 MG tablet TAKE 1 TABLET (5 MG TOTAL) BY MOUTH DAILY. 06/13/23  Yes Benjiman Bras, MD  Multiple Vitamin (MULTIVITAMIN) tablet Take 1 tablet by mouth daily.   Yes [provider]   Social History   Socioeconomic History   Marital status: Married    Spouse name: Diane    Number of children: 1   Years of education: Not on file   Highest education level: Some college, no degree  Occupational History   Occupation: Investment banker, corporate: [employer]  Tobacco Use   Smoking status: Former    Current packs/day: 0.00    Types: Cigarettes    Start date: 11/20/1994    Quit date: 11/20/1996    Years since quitting: 27.3   Smokeless tobacco: Never   Tobacco comments:    SMOKED IN HIGH SCHOOL AT LEAST 3 CIGARETTES PER DAY  Substance and Sexual Activity   Alcohol use: Yes    Alcohol/week: 4.0 standard drinks of alcohol    Types: 4 Cans of beer per week    Comment: 1 week   Drug use: No   Sexual activity: Yes    Comment: number of sex partners in the last 12 months 1  Other Topics Concern   Not on file  Social History  Narrative   Exercise doing weights and cardio 5 x/week for 1 hour.    Patient works at [employer].   Patient has some college.   Patient is married with one child.    Social Drivers of Corporate investment banker Strain: Low Risk  (03/27/2024)   Overall Financial Resource Strain (CARDIA)    Difficulty of Paying Living Expenses: Not hard at all  Food Insecurity: No Food  Insecurity (03/27/2024)   Hunger Vital Sign    Worried About Running Out of Food in the Last Year: Never true    Ran Out of Food in the Last Year: Never true  Transportation Needs: No Transportation Needs (03/27/2024)   PRAPARE - Administrator, Civil Service (Medical): No    Lack of Transportation (Non-Medical): No  Physical Activity: Insufficiently Active (03/27/2024)   Exercise Vital Sign    Days of Exercise per Week: 3 days    Minutes of Exercise per Session: 30 min  Stress: No Stress Concern Present (03/27/2024)   [employer] of Occupational Health - Occupational Stress Questionnaire    Feeling of Stress : Only a little  Social Connections: Moderately Isolated (03/27/2024)   Social Connection and Isolation Panel [NHANES]    Frequency of Communication with Friends and Family: More than three times a week    Frequency of Social Gatherings with Friends and Family: Three times a week    Attends Religious Services: Never    Active Member of Clubs or Organizations: No    Attends Engineer, structural: Not on file    Marital Status: Married  Catering manager Violence: Not on file    Review of Systems 13 point review of systems per patient health survey noted.  Negative other than as indicated above or in HPI.    Objective:   Vitals:   03/27/24 0828  BP: 128/72  Pulse: 76  Temp: 98.1 F (36.7 C)  TempSrc: Temporal  SpO2: 98%  Weight: 217 lb 6.4 oz (98.6 kg)  Height: 5' 11.75" (1.822 m)     Physical Exam Vitals reviewed.  Constitutional:      Appearance: He is well-developed.  HENT:      Head: Normocephalic and atraumatic.     Right Ear: External ear normal.     Left Ear: External ear normal.  Eyes:     Conjunctiva/sclera: Conjunctivae normal.     Pupils: Pupils are equal, round, and reactive to light.  Neck:     Thyroid : No thyromegaly.  Cardiovascular:     Rate and Rhythm: Normal rate and regular rhythm.     Heart sounds: Normal heart sounds.  Pulmonary:     Effort: Pulmonary effort is normal. No respiratory distress.     Breath sounds: Normal breath sounds. No wheezing.  Abdominal:     General: There is no distension.     Palpations: Abdomen is soft.     Tenderness: There is no abdominal tenderness.  Musculoskeletal:        General: No tenderness. Normal range of motion.     Cervical back: Normal range of motion and neck supple.  Lymphadenopathy:     Cervical: No cervical adenopathy.  Skin:    General: Skin is warm and dry.  Neurological:     Mental Status: He is alert and oriented to person, place, and time.     Deep Tendon Reflexes: Reflexes are normal and symmetric.  Psychiatric:        Behavior: Behavior normal.        Assessment & Plan:  Jim Fisher is a 48 y.o. male . Annual physical exam  - -anticipatory guidance as below in AVS, screening labs above. Health maintenance items as above in HPI discussed/recommended as applicable.   Screening for colon cancer - Plan: Ambulatory referral to Gastroenterology  Essential hypertension - Plan: CBC, TSH, lisinopril  (ZESTRIL ) 10 MG tablet, lisinopril  (ZESTRIL ) 5 MG tablet  -  Stable, tolerating current regimen. Medications refilled. Labs pending as above.   Hyperlipidemia, unspecified hyperlipidemia type - Plan: Comprehensive metabolic panel with GFR, Lipid panel  - Check labs, likely will try diet/exercise approach with recheck levels in 6 months depending on readings.  Need for Tdap vaccination - Plan: Tdap vaccine greater than or equal to 7yo IM   Meds ordered this encounter  Medications    lisinopril  (ZESTRIL ) 10 MG tablet    Sig: Take 1 tablet (10 mg total) by mouth daily.    Dispense:  90 tablet    Refill:  2   lisinopril  (ZESTRIL ) 5 MG tablet    Sig: Take 1 tablet (5 mg total) by mouth daily.    Dispense:  90 tablet    Refill:  2   Patient Instructions  Thanks for coming in today.  No change in medicines, blood pressure looks good at the 50 mg of lisinopril .  I sent in refills.  I will check the cholesterol levels again but I think that watching diet and incorporating exercise would be a reasonable initial approach.  If any other concerns on labs I will let you know.  Tdap vaccine was updated today, good for 10 years.  I will refer you to gastroenterology for colon cancer screening.  Let me know if there are questions and take care!  Preventive Care 52-23 Years Old, Male Preventive care refers to lifestyle choices and visits with your health care provider that can promote health and wellness. Preventive care visits are also called wellness exams. What can I expect for my preventive care visit? Counseling During your preventive care visit, your health care provider may ask about your: Medical history, including: Past medical problems. Family medical history. Current health, including: Emotional well-being. Home life and relationship well-being. Sexual activity. Lifestyle, including: Alcohol, nicotine or tobacco, and drug use. Access to firearms. Diet, exercise, and sleep habits. Safety issues such as seatbelt and bike helmet use. Sunscreen use. Work and work Astronomer. Physical exam Your health care provider will check your: Height and weight. These may be used to calculate your BMI (body mass index). BMI is a measurement that tells if you are at a healthy weight. Waist circumference. This measures the distance around your waistline. This measurement also tells if you are at a healthy weight and may help predict your risk of certain diseases, such as type 2  diabetes and high blood pressure. Heart rate and blood pressure. Body temperature. Skin for abnormal spots. What immunizations do I need?  Vaccines are usually given at various ages, according to a schedule. Your health care provider will recommend vaccines for you based on your age, medical history, and lifestyle or other factors, such as travel or where you work. What tests do I need? Screening Your health care provider may recommend screening tests for certain conditions. This may include: Lipid and cholesterol levels. Diabetes screening. This is done by checking your blood sugar (glucose) after you have not eaten for a while (fasting). Hepatitis B test. Hepatitis C test. HIV (human immunodeficiency virus) test. STI (sexually transmitted infection) testing, if you are at risk. Lung cancer screening. Prostate cancer screening. Colorectal cancer screening. Talk with your health care provider about your test results, treatment options, and if necessary, the need for more tests. Follow these instructions at home: Eating and drinking  Eat a diet that includes fresh fruits and vegetables, whole grains, lean protein, and low-fat dairy products. Take vitamin and mineral supplements as recommended by your  health care provider. Do not drink alcohol if your health care provider tells you not to drink. If you drink alcohol: Limit how much you have to 0-2 drinks a day. Know how much alcohol is in your drink. In the [place] one drink equals one 12 oz bottle of beer (355 mL), one 5 oz glass of wine (148 mL), or one 1 oz glass of hard liquor (44 mL). Lifestyle Brush your teeth every morning and night with fluoride toothpaste. Floss one time each day. Exercise for at least 30 minutes 5 or more days each week. Do not use any products that contain nicotine or tobacco. These products include cigarettes, chewing tobacco, and vaping devices, such as e-cigarettes. If you need help quitting, ask your  health care provider. Do not use drugs. If you are sexually active, practice safe sex. Use a condom or other form of protection to prevent STIs. Take aspirin  only as told by your health care provider. Make sure that you understand how much to take and what form to take. Work with your health care provider to find out whether it is safe and beneficial for you to take aspirin  daily. Find healthy ways to manage stress, such as: Meditation, yoga, or listening to music. Journaling. Talking to a trusted person. Spending time with friends and family. Minimize exposure to UV radiation to reduce your risk of skin cancer. Safety Always wear your seat belt while driving or riding in a vehicle. Do not drive: If you have been drinking alcohol. Do not ride with someone who has been drinking. When you are tired or distracted. While texting. If you have been using any mind-altering substances or drugs. Wear a helmet and other protective equipment during sports activities. If you have firearms in your house, make sure you follow all gun safety procedures. What's next? Go to your health care provider once a year for an annual wellness visit. Ask your health care provider how often you should have your eyes and teeth checked. Stay up to date on all vaccines. This information is not intended to replace advice given to you by your health care provider. Make sure you discuss any questions you have with your health care provider. Document Revised: 05/04/2021 Document Reviewed: 05/04/2021 Elsevier Patient Education  2024 Elsevier Inc.    Signed,   Caro Christmas, MD [facility], [facility] 03/27/24 9:25 AM

## 2024-03-27 NOTE — Patient Instructions (Signed)
 Thanks for coming in today.  No change in medicines, blood pressure looks good at the 50 mg of lisinopril .  I sent in refills.  I will check the cholesterol levels again but I think that watching diet and incorporating exercise would be a reasonable initial approach.  If any other concerns on labs I will let you know.  Tdap vaccine was updated today, good for 10 years.  I will refer you to gastroenterology for colon cancer screening.  Let me know if there are questions and take care!  Preventive Care 46-6 Years Old, Male Preventive care refers to lifestyle choices and visits with your health care provider that can promote health and wellness. Preventive care visits are also called wellness exams. What can I expect for my preventive care visit? Counseling During your preventive care visit, your health care provider may ask about your: Medical history, including: Past medical problems. Family medical history. Current health, including: Emotional well-being. Home life and relationship well-being. Sexual activity. Lifestyle, including: Alcohol, nicotine or tobacco, and drug use. Access to firearms. Diet, exercise, and sleep habits. Safety issues such as seatbelt and bike helmet use. Sunscreen use. Work and work Astronomer. Physical exam Your health care provider will check your: Height and weight. These may be used to calculate your BMI (body mass index). BMI is a measurement that tells if you are at a healthy weight. Waist circumference. This measures the distance around your waistline. This measurement also tells if you are at a healthy weight and may help predict your risk of certain diseases, such as type 2 diabetes and high blood pressure. Heart rate and blood pressure. Body temperature. Skin for abnormal spots. What immunizations do I need?  Vaccines are usually given at various ages, according to a schedule. Your health care provider will recommend vaccines for you based on your  age, medical history, and lifestyle or other factors, such as travel or where you work. What tests do I need? Screening Your health care provider may recommend screening tests for certain conditions. This may include: Lipid and cholesterol levels. Diabetes screening. This is done by checking your blood sugar (glucose) after you have not eaten for a while (fasting). Hepatitis B test. Hepatitis C test. HIV (human immunodeficiency virus) test. STI (sexually transmitted infection) testing, if you are at risk. Lung cancer screening. Prostate cancer screening. Colorectal cancer screening. Talk with your health care provider about your test results, treatment options, and if necessary, the need for more tests. Follow these instructions at home: Eating and drinking  Eat a diet that includes fresh fruits and vegetables, whole grains, lean protein, and low-fat dairy products. Take vitamin and mineral supplements as recommended by your health care provider. Do not drink alcohol if your health care provider tells you not to drink. If you drink alcohol: Limit how much you have to 0-2 drinks a day. Know how much alcohol is in your drink. In the [place] one drink equals one 12 oz bottle of beer (355 mL), one 5 oz glass of wine (148 mL), or one 1 oz glass of hard liquor (44 mL). Lifestyle Brush your teeth every morning and night with fluoride toothpaste. Floss one time each day. Exercise for at least 30 minutes 5 or more days each week. Do not use any products that contain nicotine or tobacco. These products include cigarettes, chewing tobacco, and vaping devices, such as e-cigarettes. If you need help quitting, ask your health care provider. Do not use drugs. If you are sexually  active, practice safe sex. Use a condom or other form of protection to prevent STIs. Take aspirin  only as told by your health care provider. Make sure that you understand how much to take and what form to take. Work with your  health care provider to find out whether it is safe and beneficial for you to take aspirin  daily. Find healthy ways to manage stress, such as: Meditation, yoga, or listening to music. Journaling. Talking to a trusted person. Spending time with friends and family. Minimize exposure to UV radiation to reduce your risk of skin cancer. Safety Always wear your seat belt while driving or riding in a vehicle. Do not drive: If you have been drinking alcohol. Do not ride with someone who has been drinking. When you are tired or distracted. While texting. If you have been using any mind-altering substances or drugs. Wear a helmet and other protective equipment during sports activities. If you have firearms in your house, make sure you follow all gun safety procedures. What's next? Go to your health care provider once a year for an annual wellness visit. Ask your health care provider how often you should have your eyes and teeth checked. Stay up to date on all vaccines. This information is not intended to replace advice given to you by your health care provider. Make sure you discuss any questions you have with your health care provider. Document Revised: 05/04/2021 Document Reviewed: 05/04/2021 Elsevier Patient Education  2024 [employer].

## 2024-04-01 ENCOUNTER — Ambulatory Visit: Payer: Self-pay | Admitting: Family Medicine

## 2024-09-17 ENCOUNTER — Ambulatory Visit: Admitting: Family Medicine

## 2024-09-17 ENCOUNTER — Ambulatory Visit: Payer: Self-pay | Admitting: Family Medicine

## 2024-09-17 ENCOUNTER — Encounter: Payer: Self-pay | Admitting: Family Medicine

## 2024-09-17 ENCOUNTER — Ambulatory Visit (INDEPENDENT_AMBULATORY_CARE_PROVIDER_SITE_OTHER)
Admission: RE | Admit: 2024-09-17 | Discharge: 2024-09-17 | Disposition: A | Discharge status: Home / Self Care | Source: Ambulatory Visit | Attending: Family Medicine | Admitting: Family Medicine

## 2024-09-17 VITALS — BP 110/76 | HR 92 | Temp 98.4°F | Resp 12 | Ht 71.75 in | Wt 222.4 lb

## 2024-09-17 DIAGNOSIS — M940 Chondrocostal junction syndrome [Tietze]: Secondary | ICD-10-CM | POA: Diagnosis not present

## 2024-09-17 DIAGNOSIS — R0789 Other chest pain: Secondary | ICD-10-CM

## 2024-09-17 DIAGNOSIS — R0781 Pleurodynia: Secondary | ICD-10-CM | POA: Diagnosis not present

## 2024-09-17 MED ORDER — MELOXICAM 7.5 MG PO TABS: 7.5000 mg | ORAL_TABLET | Freq: Every day | ORAL | 0 refills | Status: AC

## 2024-09-17 NOTE — Patient Instructions (Signed)
 I suspect you have costochondritis or inflammation of the cartilage where it joins up to the bone in the ribs.  It would be unlikely to have a rib fracture with your symptoms but I think it would be reasonable to check a chest x-ray, and rib x-ray to look for other concerns.  Please have that performed at the [facility] location below.  Try meloxicam once per day for the next week or 2.  That can be used a little bit longer if needed.  Avoid specific exercises that cause pain in that area, gentle range of motion and stretching is fine.  If any new or worsening symptoms let me know but I expect this to be getting better in the next few weeks.  Take care!  [facility] or xray: Walk in 8:30-4:30 during weekdays, no appointment needed [address]  [place], [place] [address]  Costochondritis  Costochondritis is inflammation of the tissue (cartilage) that connects the ribs to the breastbone (sternum). This causes pain in the front of the chest. The pain often starts slowly and involves more than one rib. What are the causes? This condition results from stress on the cartilage where your ribs attach to your sternum. The exact cause of this stress is not always known. The cause may be: Chest injury. Exercise or activity. This may include lifting. Severe coughing. What increases the risk? You are more likely to develop this condition if: You are male. You are 73-51 years old. You just started a new exercise or work activity. You have low levels of vitamin D. You have a condition that makes you cough often. What are the signs or symptoms? The main symptom of this condition is chest pain. The pain: Starts slowly and can be sharp or dull. Gets worse with deep breathing, coughing, or exercise. Gets better with rest. May be worse when you press on the affected area of your ribs and sternum. How is this diagnosed? This condition is diagnosed based on your symptoms, your medical history, and a  physical exam. Your health care provider will check for pain when pressing on your sternum. You may also have tests to rule out other causes of chest pain. These may include: A chest X-ray. This may be done to check for lung problems. An electrocardiogram (ECG). This may be done to see if you have a heart problem that could be causing the pain. An imaging scan. This may be done to rule out a broken bone (fracture) in your chest or ribcage. How is this treated? This condition may go away on its own over time. Your health care provider may prescribe an NSAID, such as ibuprofen , to reduce pain and inflammation. Treatment may also include: Resting and avoiding activities that make pain worse. Putting heat or ice on the area to reduce pain and inflammation. Doing exercises to stretch your chest muscles. If these treatments do not help, your health care provider may inject a numbing medicine at the spot where the sternum and rib connect. This can help relieve the pain. Follow these instructions at home: Managing pain, stiffness, and swelling     If directed, put ice on the painful area. To do this: Put ice in a plastic bag. Place a towel between your skin and the bag. Leave the ice on for 20 minutes, 2-3 times a day. If directed, apply heat to the affected area as often as told by your health care provider. Use the heat source that your health  care provider recommends, such as a moist heat pack or a heating pad. Place a towel between your skin and the heat source. Leave the heat on for 20-30 minutes. If your skin turns bright red, remove the heat or ice right away to prevent skin damage. The risk of skin damage is higher if you cannot feel pain, heat, or cold. Activity Rest as told by your health care provider. Avoid activities that make pain worse. This includes activities that use the muscles in your chest, abdomen, and sides. You may have to avoid lifting. Ask your health care provider how much  you can safely lift. Return to your normal activities as told by your health care provider. Ask your health care provider what activities are safe for you. General instructions Take over-the-counter and prescription medicines only as told by your health care provider. Contact a health care provider if: You have chills or a fever. Your pain does not go away, or it gets worse. You have a cough that does not go away. Get help right away if: You feel short of breath. You have severe chest pain that does not get better with medicines, heat, or ice. These symptoms may be an emergency. Get help right away. Call 911. Do not wait to see if the symptoms will go away. Do not drive yourself to the hospital. This information is not intended to replace advice given to you by your health care provider. Make sure you discuss any questions you have with your health care provider. Document Revised: 05/25/2022 Document Reviewed: 05/25/2022 Elsevier Patient Education  2024 [employer].

## 2024-09-17 NOTE — Progress Notes (Signed)
 Subjective:  Patient ID: Jim Fisher, male    DOB: 12/14/1975  Age: 48 y.o. MRN: [id number]  CC:  Chief Complaint  Patient presents with   Chest Pain    Having rib pain when lifting weights 4 months ago. Saturday was playing poker and when he started laughing after a joke, pain got worse. If he extends his arms he has the pain.     HPI Jim Fisher presents for   Chest wall/rib pain Initially noted with weight lifting approximately 4 months ago- slight pain in front of lower R ribs. No pop/noise. Slight strain. Able to still work out. Much worse pain after laughing 4 days ago and more since.  No dyspnea. No new cough.  Sore to lean forward.  Sore to press on area, no swelling or bruising.   Tx: none.   No hx of PUD/CKD.  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 Authorizing Provider  levETIRAcetam  (KEPPRA ) 750 MG tablet Take 1 tablet (750 mg total) by mouth 2 (two) times daily. 09/18/23  Yes Gayland Lauraine PARAS, NP  lisinopril  (ZESTRIL ) 10 MG tablet Take 1 tablet (10 mg total) by mouth daily. 03/27/24  Yes Levora Reyes SAUNDERS, MD  lisinopril  (ZESTRIL ) 5 MG tablet Take 1 tablet (5 mg total) by mouth daily. 03/27/24  Yes Levora Reyes SAUNDERS, MD  Multiple Vitamin (MULTIVITAMIN) tablet Take 1 tablet by mouth daily.   Yes [provider]   Social History   Socioeconomic History   Marital status: Married    Spouse name:  Diane    Number of children: 1   Years of education: Not on file   Highest education level: Some college, no degree  Occupational History   Occupation: Investment Banker, Corporate: [employer]  Tobacco Use   Smoking status: Former    Current packs/day: 0.00    Types: Cigarettes    Start date: 11/20/1994    Quit date: 11/20/1996    Years since quitting: 27.8   Smokeless tobacco: Never   Tobacco comments:    SMOKED IN HIGH SCHOOL AT LEAST 3 CIGARETTES PER DAY  Substance and Sexual Activity   Alcohol use: Yes    Alcohol/week: 4.0 standard drinks of alcohol    Types: 4 Cans of beer per week    Comment: 1 week   Drug use: No   Sexual activity: Yes    Comment: number of sex partners in the last 12 months 1  Other Topics Concern   Not on file  Social History Narrative   Exercise doing weights and cardio 5 x/week for 1 hour.    Patient works at [employer].   Patient has some college.   Patient is married with one child.    Social Drivers of Health   Financial Resource Strain: Low Risk  (03/27/2024)   Overall Financial Resource Strain (CARDIA)    Difficulty of  Paying Living Expenses: Not hard at all  Food Insecurity: No Food Insecurity (03/27/2024)   Hunger Vital Sign    Worried About Running Out of Food in the Last Year: Never true    Ran Out of Food in the Last Year: Never true  Transportation Needs: No Transportation Needs (03/27/2024)   PRAPARE - Administrator, Civil Service (Medical): No    Lack of Transportation (Non-Medical): No  Physical Activity: Insufficiently Active (03/27/2024)   Exercise Vital Sign    Days of Exercise per Week: 3 days    Minutes of Exercise per Session: 30 min  Stress: No Stress Concern Present (03/27/2024)   [employer] of Occupational Health - Occupational Stress Questionnaire    Feeling of Stress : Only a little  Social Connections: Moderately Isolated (03/27/2024)   Social Connection and Isolation Panel    Frequency of Communication with Friends and  Family: More than three times a week    Frequency of Social Gatherings with Friends and Family: Three times a week    Attends Religious Services: Never    Active Member of Clubs or Organizations: No    Attends Banker Meetings: Not on file    Marital Status: Married  Catering Manager Violence: Not on file    Review of Systems Per HPI  Objective:   Vitals:   09/17/24 0855  BP: 110/76  Pulse: 92  Resp: 12  Temp: 98.4 F (36.9 C)  TempSrc: Temporal  SpO2: 98%  Weight: 222 lb 6.4 oz (100.9 kg)  Height: 5' 11.75 (1.822 m)     Physical Exam Vitals reviewed.  Constitutional:      Appearance: He is well-developed.  HENT:     Head: Normocephalic and atraumatic.  Neck:     Vascular: No carotid bruit or JVD.  Cardiovascular:     Rate and Rhythm: Normal rate and regular rhythm.     Heart sounds: Normal heart sounds. No murmur heard. Pulmonary:     Effort: Pulmonary effort is normal.     Breath sounds: Normal breath sounds. No rales.  Chest:    Abdominal:     General: Abdomen is flat. There is no distension.     Tenderness: There is no abdominal tenderness. There is no guarding.  Musculoskeletal:     Right lower leg: No edema.     Left lower leg: No edema.  Skin:    General: Skin is warm and dry.  Neurological:     Mental Status: He is alert and oriented to person, place, and time.  Psychiatric:        Mood and Affect: Mood normal.      Assessment & Plan:  Jim Fisher is a 48 y.o. male . Right-sided chest wall pain - Plan: meloxicam (MOBIC) 7.5 MG tablet, DG Ribs Unilateral W/Chest Right  Costochondritis, acute - Plan: meloxicam (MOBIC) 7.5 MG tablet, DG Ribs Unilateral W/Chest Right Suspected costochondritis, chest wall pain, possibly from overuse initially few months ago, flare symptoms recently with coughing.  Less likely rib fracture/injury.  Check imaging as above.  Trial of meloxicam, activity modification with RTC precautions  given.  Meds ordered this encounter  Medications   meloxicam (MOBIC) 7.5 MG tablet    Sig: Take 1 tablet (7.5 mg total) by mouth daily.    Dispense:  30 tablet    Refill:  0   Patient Instructions  I suspect you have costochondritis or inflammation of the cartilage where it joins  up to the bone in the ribs.  It would be unlikely to have a rib fracture with your symptoms but I think it would be reasonable to check a chest x-ray, and rib x-ray to look for other concerns.  Please have that performed at the [facility] location below.  Try meloxicam once per day for the next week or 2.  That can be used a little bit longer if needed.  Avoid specific exercises that cause pain in that area, gentle range of motion and stretching is fine.  If any new or worsening symptoms let me know but I expect this to be getting better in the next few weeks.  Take care!  [facility] or xray: Walk in 8:30-4:30 during weekdays, no appointment needed [address]  [place], [place] [address]  Costochondritis  Costochondritis is inflammation of the tissue (cartilage) that connects the ribs to the breastbone (sternum). This causes pain in the front of the chest. The pain often starts slowly and involves more than one rib. What are the causes? This condition results from stress on the cartilage where your ribs attach to your sternum. The exact cause of this stress is not always known. The cause may be: Chest injury. Exercise or activity. This may include lifting. Severe coughing. What increases the risk? You are more likely to develop this condition if: You are male. You are 33-68 years old. You just started a new exercise or work activity. You have low levels of vitamin D. You have a condition that makes you cough often. What are the signs or symptoms? The main symptom of this condition is chest pain. The pain: Starts slowly and can be sharp or dull. Gets worse with deep breathing, coughing, or  exercise. Gets better with rest. May be worse when you press on the affected area of your ribs and sternum. How is this diagnosed? This condition is diagnosed based on your symptoms, your medical history, and a physical exam. Your health care provider will check for pain when pressing on your sternum. You may also have tests to rule out other causes of chest pain. These may include: A chest X-ray. This may be done to check for lung problems. An electrocardiogram (ECG). This may be done to see if you have a heart problem that could be causing the pain. An imaging scan. This may be done to rule out a broken bone (fracture) in your chest or ribcage. How is this treated? This condition may go away on its own over time. Your health care provider may prescribe an NSAID, such as ibuprofen , to reduce pain and inflammation. Treatment may also include: Resting and avoiding activities that make pain worse. Putting heat or ice on the area to reduce pain and inflammation. Doing exercises to stretch your chest muscles. If these treatments do not help, your health care provider may inject a numbing medicine at the spot where the sternum and rib connect. This can help relieve the pain. Follow these instructions at home: Managing pain, stiffness, and swelling     If directed, put ice on the painful area. To do this: Put ice in a plastic bag. Place a towel between your skin and the bag. Leave the ice on for 20 minutes, 2-3 times a day. If directed, apply heat to the affected area as often as told by your health care provider. Use the heat source that your health care provider recommends, such as a moist heat pack or a heating pad.  Place a towel between your skin and the heat source. Leave the heat on for 20-30 minutes. If your skin turns bright red, remove the heat or ice right away to prevent skin damage. The risk of skin damage is higher if you cannot feel pain, heat, or cold. Activity Rest as told by  your health care provider. Avoid activities that make pain worse. This includes activities that use the muscles in your chest, abdomen, and sides. You may have to avoid lifting. Ask your health care provider how much you can safely lift. Return to your normal activities as told by your health care provider. Ask your health care provider what activities are safe for you. General instructions Take over-the-counter and prescription medicines only as told by your health care provider. Contact a health care provider if: You have chills or a fever. Your pain does not go away, or it gets worse. You have a cough that does not go away. Get help right away if: You feel short of breath. You have severe chest pain that does not get better with medicines, heat, or ice. These symptoms may be an emergency. Get help right away. Call 911. Do not wait to see if the symptoms will go away. Do not drive yourself to the hospital. This information is not intended to replace advice given to you by your health care provider. Make sure you discuss any questions you have with your health care provider. Document Revised: 05/25/2022 Document Reviewed: 05/25/2022 Elsevier Patient Education  2024 Else[employer] Inc[employer]    Signed,   Reyes Pines, MD [facility], [facility] 09/17/24 9:44 AM

## 2024-09-23 ENCOUNTER — Other Ambulatory Visit: Payer: Self-pay | Admitting: Neurology

## 2024-09-23 NOTE — Progress Notes (Unsigned)
   Virtual Visit via Video Note  I connected with Jim Fisher on 09/24/24 at  1:45 PM EST by a video enabled telemedicine application and verified that I am speaking with the correct person using two identifiers.  Location: Patient: at work Provider: in the office    I discussed the limitations of evaluation and management by telemedicine and the availability of in person appointments. The patient expressed understanding and agreed to proceed.  History of Present Illness: 09/24/24 SS: VV, no seizures, remains on Keppra  750 mg BID.  Doing excellent.  Is working out.  Just got a job promotion at [employer].  Compliant with Keppra .  09/18/23 SS: Via VV, no seizures. Remains on Keppra  750 mg BID. Reports great compliance. Health has been good.  She is looking for a new primary care doctor.  Continues to work at [employer].  No new issues or concerns.  09/13/22 SS: Jim Fisher is here today for follow-up.  Remains on Keppra .  Last seizure was in April 2021 when he tried to taper off Keppra  (had also missed 2 doses around the same time, got the COVID vaccine). No side effects reported. Continues to work at [employer]. No new issues.    HISTORY 09/13/21 Dr. Jenel: Jim Fisher is a 48 year old right-handed black male with a history of seizures.  The last seizure was in April 2021 when he tried to taper off of the Keppra  after being seizure-free for about 10 years.  The patient has done well back on the Keppra .  He tolerates the medication well.  He continues to operate a librarian, academic, he works for [employer].  He is being treated for hypertension and he has had an elevated hemoglobin A1c suggestive of prediabetes.  He has started to exercise on a regular basis and has gone on a low carbohydrate diet.  He returns for an evaluation.   Observations/Objective: Via video visit  Assessment and Plan: 1.  History of seizures  -Doing great, no recent seizures -Continue Keppra  750 mg twice a day -Call for  seizure activity, otherwise follow-up 1 year  Meds ordered this encounter  Medications   levETIRAcetam  (KEPPRA ) 750 MG tablet    Sig: Take 1 tablet (750 mg total) by mouth 2 (two) times daily.    Dispense:  180 tablet    Refill:  4   Follow Up Instructions: 1 year 15 min VV with me   I discussed the assessment and treatment plan with the patient. The patient was provided an opportunity to ask questions and all were answered. The patient agreed with the plan and demonstrated an understanding of the instructions.   The patient was advised to call back or seek an in-person evaluation if the symptoms worsen or if the condition fails to improve as anticipated.   Lauraine Gayland MANDES, DNP  [facility] [address], [address] [place], [place] [address] [phone]

## 2024-09-23 NOTE — Telephone Encounter (Signed)
 Last seen on 09/18/23 per note  Doing great, continue Keppra  750 mg twice daily  Follow up scheduled on 09/24/24

## 2024-09-24 ENCOUNTER — Telehealth: Payer: BC Managed Care – PPO | Admitting: Neurology

## 2024-09-24 DIAGNOSIS — Z8669 Personal history of other diseases of the nervous system and sense organs: Secondary | ICD-10-CM | POA: Diagnosis not present

## 2024-09-24 DIAGNOSIS — G40309 Generalized idiopathic epilepsy and epileptic syndromes, not intractable, without status epilepticus: Secondary | ICD-10-CM

## 2024-09-24 MED ORDER — LEVETIRACETAM 750 MG PO TABS: 750.0000 mg | ORAL_TABLET | Freq: Two times a day (BID) | ORAL | 4 refills | Status: AC

## 2024-09-24 NOTE — Patient Instructions (Signed)
 Great to see you today! Congratulations on your job promotion! Continue Keppra  for seizure prevention Do not miss any doses Call for seizure activity.  Follow-up in 1 year.  Thanks!!

## 2024-09-29 ENCOUNTER — Ambulatory Visit: Admitting: Family Medicine

## 2024-12-25 ENCOUNTER — Other Ambulatory Visit: Payer: Self-pay | Admitting: Family Medicine

## 2024-12-25 DIAGNOSIS — I10 Essential (primary) hypertension: Secondary | ICD-10-CM

## 2025-01-19 ENCOUNTER — Ambulatory Visit: Admitting: Family Medicine

## 2025-09-30 ENCOUNTER — Telehealth: Admitting: Neurology
